# Patient Record
Sex: Male | Born: 1980 | Race: Black or African American | Hispanic: No | Marital: Married | Smoking: Never smoker
Health system: Southern US, Community
[De-identification: ages and names within clinical notes are randomized; demographics above are authoritative.]

## PROBLEM LIST (undated history)

## (undated) HISTORY — PX: NO PAST SURGERIES: SHX2092

---

## 2014-06-11 ENCOUNTER — Ambulatory Visit: Payer: Self-pay

## 2014-06-12 ENCOUNTER — Inpatient Hospital Stay: Payer: Self-pay | Admitting: Internal Medicine

## 2014-06-12 LAB — CBC WITH DIFFERENTIAL/PLATELET
BASOS ABS: 0 10*3/uL (ref 0.0–0.1)
Basophil %: 0.3 %
Eosinophil #: 0.2 10*3/uL (ref 0.0–0.7)
Eosinophil %: 1.9 %
HCT: 39.4 % — AB (ref 40.0–52.0)
HGB: 12.9 g/dL — AB (ref 13.0–18.0)
LYMPHS PCT: 28.4 %
Lymphocyte #: 2.5 10*3/uL (ref 1.0–3.6)
MCH: 31.1 pg (ref 26.0–34.0)
MCHC: 32.8 g/dL (ref 32.0–36.0)
MCV: 95 fL (ref 80–100)
Monocyte #: 1.2 x10 3/mm — ABNORMAL HIGH (ref 0.2–1.0)
Monocyte %: 14.2 %
Neutrophil #: 4.8 10*3/uL (ref 1.4–6.5)
Neutrophil %: 55.2 %
Platelet: 192 10*3/uL (ref 150–440)
RBC: 4.15 10*6/uL — AB (ref 4.40–5.90)
RDW: 13.5 % (ref 11.5–14.5)
WBC: 8.7 10*3/uL (ref 3.8–10.6)

## 2014-06-12 LAB — BASIC METABOLIC PANEL
Anion Gap: 5 — ABNORMAL LOW (ref 7–16)
BUN: 17 mg/dL (ref 7–18)
Calcium, Total: 8.8 mg/dL (ref 8.5–10.1)
Chloride: 106 mmol/L (ref 98–107)
Co2: 33 mmol/L — ABNORMAL HIGH (ref 21–32)
Creatinine: 1.05 mg/dL (ref 0.60–1.30)
EGFR (African American): >60
EGFR (Non-African Amer.): >60
Glucose: 96 mg/dL (ref 65–99)
OSMOLALITY: 288 (ref 275–301)
POTASSIUM: 4.2 mmol/L (ref 3.5–5.1)
SODIUM: 144 mmol/L (ref 136–145)

## 2014-06-17 LAB — CULTURE, BLOOD (SINGLE)

## 2014-12-04 NOTE — H&P (Signed)
PATIENT NAME:  LAJARVIS, ITALIANO MR#:  [id number] DATE OF BIRTH:  1981/06/14  DATE OF ADMISSION:  06/12/2014  PRIMARY CARE PHYSICIAN: None.  CHIEF COMPLAINT: Pain in the left leg.   HISTORY OF PRESENT ILLNESS: Mr. Faulcon is a 34 year old with no past medical history, who has been experiencing fever and chills since Monday. Concerning this, he went to the [facility]. There the patient was given doxycycline and Augmentin. The patient was also given 2 IM injections when the patient went back the following day. The patient initially noted to have some pain in the left leg; however, initially attributed it to a (Dictation Anomaly) <<MISSING TEXT>> infection; however, by Tuesday, the patient's redness significantly worsened. The patient was also seen by Dr. Sampson Goon and was given 2 doses of (Dictation Anomaly) <<MISSING TEXT>> antibiotic, which the patient could not name it. The patient continues to have worsening of the redness involving the entire leg below the knee. Concerning this, came to the Emergency Department. Workup in the Emergency Department, CBC and CMP were completely within normal limits. The patient had an ultrasound of the leg to rule out DVT, which was negative.   PAST MEDICAL HISTORY: None.   PAST SURGICAL HISTORY: None.   ALLERGIES: No known drug allergies.  HOME MEDICATIONS: 1. Tramadol 50 mg every 6 hours as needed.  2. Naprosyn (Dictation Anomaly) <<MISSING TEXT>> mg 2 times a day. 3. Doxycycline 100 mg 2 times a day.  4. Augmentin 1 tablet every 12 hours.   SOCIAL HISTORY: Has no history of smoking, drinking alcohol or using illicit drugs. Lives with his girlfriend.  FAMILY HISTORY: No obvious health problems run in the family. Both parents are healthy.   REVIEW OF SYSTEMS:  CONSTITUTIONAL: Denies any generalized weakness. EYES: No change in vision.   ENT: No change in hearing.  RESPIRATORY: No cough or shortness of breath.  CARDIOVASCULAR: No chest pain,  palpitations.  GASTROINTESTINAL: No nausea, vomiting, abdominal pain.  GENITOURINARY: No dysuria or hematuria.  HEME: No easy bruising or bleeding.  SKIN: Has redness and swelling of the left leg.  MUSCULOSKELETAL: Has pain in the left leg.  NEUROLOGIC: No weakness or numbness in any part of the body.   PHYSICAL EXAMINATION:  GENERAL: Well built, well nourished, age appropriate male, lying down in the bed, not in distress.  VITAL SIGNS: Temperature 97.9, pulse 66, blood pressure 129/78, respiratory rate of 18, oxygen saturations 99% on room air.  HEENT: Head normocephalic, atraumatic. No scleral icterus. Conjunctiva normal. Pupils equal, round and reactive. Mucous membranes are moist. (Dictation Anomaly) <<MISSING TEXT>> erythema.  NECK: Supple, no lymphadenopathy. No JVD, no carotid bruits, no thyromegaly.  CHEST: Has no focal tenderness bilaterally. Clear to auscultation.  HEART: S1, S2 regular. No murmurs are heard.  ABDOMEN: Bowel sounds plus, soft, nontender, nondistended. No hepatosplenomegaly.  EXTREMITIES: No pedal edema. Pulses 2+.  SKIN: Has redness in the left lower leg starting below the knee and extending up to just above the ankle. Redness and mild increase in size.  NEUROLOGIC: The patient is alert, oriented to place, person and time. Cranial nerves II through XII intact. Motor is 5/5 in upper and lower extremities.  MUSCULOSKELETAL: Good range of motion in all extremities.   LABORATORIES: CBC and BMP are completely within normal limits.   Ultrasound of the leg has left inguinal adenopathy (Dictation Anomaly) <<MISSING TEXT>> 2.6 x 1.1 x 1.7 cm.   ASSESSMENT AND PLAN: Mr. Ruacho is a 34 year old male who comes to the  Emergency Department with left leg cellulitis.   1. Left leg cellulitis. The patient currently does not have any fever. No elevated white blood count. No left shift; however, the patient has significant erythema and increased swelling of the left leg. We will  keep the patient on vancomycin and Zosyn. Also concerning for other processes like dermatitis. The patient has a reactive lymph node in the left leg. Start the patient on vancomycin and follow up. The patient denies having any trauma to the left leg.  2. Reactive lymphadenopathy in the left inguinal area. Most likely this is reactive from the left leg process. We will need (Dictation Anomaly) <<MISSING TEXT>> follow up closely.  3. Keep the patient on deep venous thrombosis prophylaxis with Lovenox.  TIME SPENT: 45 minutes.     ____________________________  Berton Bon:JT D: 06/13/2014 05:03:22 ET T: 06/13/2014 08:55:52 ET JOB#: [id number]  cc: Test Jamael Hoffmann, <Dictator>

## 2014-12-04 NOTE — H&P (Signed)
PATIENT NAME:  Duane Campos, Duane Campos MR#:  [id number] DATE OF BIRTH:  Jul 24, 1981  DATE OF ADMISSION:  06/12/2014  PRIMARY CARE PHYSICIAN: None.  CHIEF COMPLAINT: Pain in the left leg.   HISTORY OF PRESENT ILLNESS: Duane Campos is a 34 year old with no past medical history, who has been experiencing fever and chills since Monday. Concerning this, [facility]. There the patient was given doxycycline and Augmentin. The patient was also given 2 IM injections when the patient went back the following day. The patient initially noted to have some pain in the left leg; however, initially attributed it to a  infection; however, by Tuesday, the patient's redness significantly worsened. The patient was also seen by Duane Campos and was given 2 doses of  antibiotic, which the patient could not name it. The patient continues to have worsening of the redness involving the entire leg below the knee. Concerning this, came to the Emergency Department. Workup in the Emergency Department, CBC and CMP were completely within normal limits. The patient had an ultrasound of the leg to rule out DVT, which was negative.   PAST MEDICAL HISTORY: None.   PAST SURGICAL HISTORY: None.   ALLERGIES: No known drug allergies.  HOME MEDICATIONS: 1. Tramadol 50 mg every 6 hours as needed.  2. Naprosyn 500 mg 2 times a day. 3. Doxycycline 100 mg 2 times a day.  4. Augmentin 1 tablet every 12 hours.   SOCIAL HISTORY: Has no history of smoking, drinking alcohol or using illicit drugs. Lives with his girlfriend.  FAMILY HISTORY: No obvious health problems run in the family. Both parents are healthy.   REVIEW OF SYSTEMS:  CONSTITUTIONAL: Denies any generalized weakness. EYES: No change in vision.   ENT: No change in hearing.  RESPIRATORY: No cough or shortness of breath.  CARDIOVASCULAR: No chest pain, palpitations.  GASTROINTESTINAL: No nausea, vomiting, abdominal pain.  GENITOURINARY: No dysuria or  hematuria.  HEME: No easy bruising or bleeding.  SKIN: Has redness and swelling of the left leg.  MUSCULOSKELETAL: Has pain in the left leg.  NEUROLOGIC: No weakness or numbness in any part of the body.   PHYSICAL EXAMINATION:  GENERAL: Well built, well nourished, age appropriate male, lying down in the bed, not in distress.  VITAL SIGNS: Temperature 97.9, pulse 66, blood pressure 129/78, respiratory rate of 18, oxygen saturations 99% on room air.  HEENT: Head normocephalic, atraumatic. No scleral icterus. Conjunctiva normal. Pupils equal, round and reactive. Mucous membranes are moist. no erythema.  NECK: Supple, no lymphadenopathy. No JVD, no carotid bruits, no thyromegaly.  CHEST: Has no focal tenderness bilaterally. Clear to auscultation.  HEART: S1, S2 regular. No murmurs are heard.  ABDOMEN: Bowel sounds plus, soft, nontender, nondistended. No hepatosplenomegaly.  EXTREMITIES: No pedal edema. Pulses 2+.  SKIN: Has redness in the left lower leg starting below the knee and extending up to just above the ankle. Redness and mild increase in size.  NEUROLOGIC: The patient is alert, oriented to place, person and time. Cranial nerves II through XII intact. Motor is 5/5 in upper and lower extremities.  MUSCULOSKELETAL: Good range of motion in all extremities.   LABORATORIES: CBC and BMP are completely within normal limits.   Ultrasound of the leg has left inguinal adenopathy up to 2.6 x 1.1 x 1.7 cm.   ASSESSMENT AND PLAN: Duane Campos is a 34 year old male who comes to the Emergency Department with left leg cellulitis.   1. Left leg cellulitis. The patient  currently does not have any fever. No elevated white blood count. No left shift; however, the patient has significant erythema and increased swelling of the left leg. We will keep the patient on vancomycin and Zosyn. Also concerning for other processes like dermatitis. The patient has a reactive lymph node in the left leg. Start the patient on  vancomycin and follow up. The patient denies having any trauma to the left leg.  2. Lymphadenopathy in the left inguinal area. Most likely this is reactive from the left leg process. We will need to follow up closely.  3. Keep the patient on deep venous thrombosis prophylaxis with Lovenox.  TIME SPENT: 45 minutes.     ____________________________ Duane Campos pv:JT D: 06/13/2014 05:03:00 ET T: 06/13/2014 08:55:52 ET JOB#: [id number]  cc: Duane Campos, <Dictator> Duane Griffins Campos ELECTRONICALLY SIGNED 06/23/2014 21:07

## 2014-12-04 NOTE — Discharge Summary (Signed)
PATIENT NAME:  Duane Campos, Duane Campos MR#:  [id number] DATE OF BIRTH:  [birth year]  DATE OF ADMISSION:  06/12/2014 DATE OF DISCHARGE:  06/14/2014  DISCHARGE DIAGNOSIS: Left leg cellulitis.   DISCHARGE MEDICATIONS: Levaquin 750 mg daily for 10 days and naproxen 500 mg p.o. b.i.d.   HOSPITAL COURSE: A 34 year old male patient admitted because of left leg pain, redness, and swelling. The patient tried doxycycline and Augmentin as an outpatient for left leg cellulitis. Because of persistent fever and tenderness in the left leg he came here. The patient's white count was normal. He was afebrile on admission. His left leg ultrasound was negative. DVT study was  negative. He was admitted to hospitalist service for failed outpatient therapy and admitted for left leg cellulitis. Started on vancomycin and Zosyn. The patient received vancomycin and Zosyn for 2 days and feels much better today. His leg swelling is down and not tender anymore, and the patient wanted to go home; discharged him with Levaquin. He just moved from [place]. He said he is going to make an appointment with one of the local doctors. We asked him if he needed any assistance to have the list. He said he will take care it on his on.   PHYSICAL EXAMINATION:  VITAL SIGNS: Blood pressure  120/70, heart rate 70.  EXTREMITIES: Left leg nontender and decreased erythema.  CARDIOVASCULAR: S1, S2 regular.  LUNGS: Clear to auscultation.  ABDOMEN: Soft, nontender, nondistended. Bowel sounds present.   TIME SPENT ON DISCHARGE PREPARATION: More than 30 minutes.    ____________________________ Katha HammingSnehalatha Shauntae Reitman, MD sk:ts D: 06/14/2014 13:00:15 ET T: 06/14/2014 21:23:51 ET JOB#: [id number]  cc: Katha HammingSnehalatha Niamya Vittitow, MD, <Dictator> Katha HammingSNEHALATHA Valeta Paz MD ELECTRONICALLY SIGNED 07/02/2014 22:59

## 2016-12-10 ENCOUNTER — Other Ambulatory Visit: Payer: Self-pay | Admitting: Family Medicine

## 2016-12-10 ENCOUNTER — Ambulatory Visit
Admission: RE | Admit: 2016-12-10 | Discharge: 2016-12-10 | Disposition: A | Discharge status: Home / Self Care | Payer: BLUE CROSS/BLUE SHIELD | Source: Ambulatory Visit | Attending: Family Medicine | Admitting: Family Medicine

## 2016-12-10 DIAGNOSIS — M7989 Other specified soft tissue disorders: Secondary | ICD-10-CM | POA: Insufficient documentation

## 2017-09-11 ENCOUNTER — Emergency Department
Admission: EM | Admit: 2017-09-11 | Discharge: 2017-09-11 | Disposition: A | Discharge status: Home / Self Care | Payer: BLUE CROSS/BLUE SHIELD | Attending: Emergency Medicine | Admitting: Emergency Medicine

## 2017-09-11 ENCOUNTER — Emergency Department: Payer: BLUE CROSS/BLUE SHIELD

## 2017-09-11 ENCOUNTER — Other Ambulatory Visit: Payer: Self-pay

## 2017-09-11 DIAGNOSIS — R1013 Epigastric pain: Secondary | ICD-10-CM | POA: Diagnosis present

## 2017-09-11 DIAGNOSIS — R740 Nonspecific elevation of levels of transaminase and lactic acid dehydrogenase [LDH]: Secondary | ICD-10-CM | POA: Insufficient documentation

## 2017-09-11 DIAGNOSIS — K8020 Calculus of gallbladder without cholecystitis without obstruction: Secondary | ICD-10-CM | POA: Insufficient documentation

## 2017-09-11 DIAGNOSIS — R7401 Elevation of levels of liver transaminase levels: Secondary | ICD-10-CM

## 2017-09-11 LAB — URINALYSIS, COMPLETE (UACMP) WITH MICROSCOPIC
BACTERIA UA: NONE SEEN
BILIRUBIN URINE: NEGATIVE
GLUCOSE, UA: NEGATIVE mg/dL
Hgb urine dipstick: NEGATIVE
KETONES UR: 5 mg/dL — AB
Leukocytes, UA: NEGATIVE
Nitrite: NEGATIVE
PH: 5 (ref 5.0–8.0)
PROTEIN: NEGATIVE mg/dL
Specific Gravity, Urine: 1.02 (ref 1.005–1.030)

## 2017-09-11 LAB — COMPREHENSIVE METABOLIC PANEL
ALBUMIN: 4.5 g/dL (ref 3.5–5.0)
ALK PHOS: 64 U/L (ref 38–126)
ALT: 151 U/L — ABNORMAL HIGH (ref 17–63)
AST: 148 U/L — ABNORMAL HIGH (ref 15–41)
Anion gap: 7 (ref 5–15)
BUN: 13 mg/dL (ref 6–20)
CALCIUM: 9.3 mg/dL (ref 8.9–10.3)
CHLORIDE: 103 mmol/L (ref 101–111)
CO2: 26 mmol/L (ref 22–32)
Creatinine, Ser: 1.11 mg/dL (ref 0.61–1.24)
GFR calc non Af Amer: >60 mL/min (ref >60)
GLUCOSE: 92 mg/dL (ref 65–99)
POTASSIUM: 3.8 mmol/L (ref 3.5–5.1)
SODIUM: 136 mmol/L (ref 135–145)
Total Bilirubin: 1.2 mg/dL (ref 0.3–1.2)
Total Protein: 7.6 g/dL (ref 6.5–8.1)

## 2017-09-11 LAB — LIPASE, BLOOD: LIPASE: 29 U/L (ref 11–51)

## 2017-09-11 LAB — CBC
HEMATOCRIT: 45.9 % (ref 40.0–52.0)
HEMOGLOBIN: 15.4 g/dL (ref 13.0–18.0)
MCH: 31.5 pg (ref 26.0–34.0)
MCHC: 33.6 g/dL (ref 32.0–36.0)
MCV: 93.7 fL (ref 80.0–100.0)
Platelets: 202 10*3/uL (ref 150–440)
RBC: 4.9 MIL/uL (ref 4.40–5.90)
RDW: 14.4 % (ref 11.5–14.5)
WBC: 9.7 10*3/uL (ref 3.8–10.6)

## 2017-09-11 MED ORDER — KETOROLAC TROMETHAMINE 30 MG/ML IJ SOLN
15.0000 mg | Freq: Once | INTRAMUSCULAR | Status: AC
  Administered 2017-09-11: 15 mg via INTRAVENOUS
  Filled 2017-09-11: qty 1

## 2017-09-11 MED ORDER — IOPAMIDOL (ISOVUE-370) INJECTION 76%
100.0000 mL | Freq: Once | INTRAVENOUS | Status: AC | PRN
  Administered 2017-09-11: 100 mL via INTRAVENOUS

## 2017-09-11 NOTE — ED Notes (Signed)
Patient transported to CT 

## 2017-09-11 NOTE — ED Triage Notes (Signed)
Pt c/o upper abd pain that radiates into back. Denies N/V/D..Marland Kitchen

## 2017-09-11 NOTE — ED Notes (Signed)
First Nurse Note:  Patient here with wife, complaining of abdominal pain and chest tightness.  Woke with symptoms this AM.  Alert and oriented.  In WC.

## 2017-09-11 NOTE — ED Provider Notes (Signed)
[facility] Emergency Department Provider Note  ____________________________________________  Time seen: Approximately 4:06 PM  I have reviewed the triage vital signs and the nursing notes.   HISTORY  Chief Complaint Abdominal Pain   HPI Duane Campos is a 37 y.o. male no significant past medical history who presents for evaluation of abdominal pain. Patient reports that he woke up this morning with epigastric abdominal pain that he describes as a tearing pain radiating to his back and center of his chest. Initially he thought he was hungry. He had a biscuit and went back to sleep. He woke up 40 minutes later and the pain was persistent. Initially the pain was severe but currently 6 out of 10. He denies ever having similar pain. The pain was not worse after eating. He does report that the pain is worse with deep inspiration and he feels short of breath. He has had no nausea or vomiting, no cough or congestion. He has had chills since this morning. No dysuria or hematuria. No prior abdominal surgeries.no personal or family history of dissection, PE, no recent travel or immobilization, no leg pain or swelling, no hemoptysis, no exogenous hormones. No family history of heart disease. Patient is nonsmoker. He reports taking NSAIDs twice a week. No h/o alcohol.   History reviewed. No pertinent past medical history.  There are no active problems to display for this patient.   History reviewed. No pertinent surgical history.  Prior to Admission medications   Not on File    Allergies Patient has no known allergies.  No family history on file.  Social History Social History   Tobacco Use  . Smoking status: Never Smoker  . Smokeless tobacco: Never Used  Substance Use Topics  . Alcohol use: No    Frequency: Never  . Drug use: No    Review of Systems  Constitutional: Negative for fever. + chills Eyes: Negative for visual changes. ENT: Negative for sore  throat. Neck: No neck pain  Cardiovascular: Negative for chest pain. Respiratory: + shortness of breath. Gastrointestinal: + epigastric abdominal pain. No vomiting or diarrhea. Genitourinary: Negative for dysuria. Musculoskeletal: Negative for back pain. Skin: Negative for rash. Neurological: Negative for headaches, weakness or numbness. Psych: No SI or HI  ____________________________________________   PHYSICAL EXAM:  VITAL SIGNS: ED Triage Vitals [09/11/17 1355]  Enc Vitals Group     BP (!) 147/80     Pulse Rate 90     Resp 17     Temp 99.5 F (37.5 C)     Temp Source Oral     SpO2 100 %     Weight 231 lb (104.8 kg)     Height 6\' 3"  (1.905 m)     Head Circumference      Peak Flow      Pain Score 7     Pain Loc      Pain Edu?      Excl. in GC?     Constitutional: Alert and oriented. Well appearing and in no apparent distress. HEENT:      Head: Normocephalic and atraumatic.         Eyes: Conjunctivae are normal. Sclera is non-icteric.       Mouth/Throat: Mucous membranes are moist.       Neck: Supple with no signs of meningismus. Cardiovascular: Regular rate and rhythm. No murmurs, gallops, or rubs. 2+ symmetrical distal pulses are present in all extremities. No JVD. Respiratory: Normal respiratory effort. Lungs are clear  to auscultation bilaterally. No wheezes, crackles, or rhonchi.  Gastrointestinal: Soft, tenderness to palpation on the epigastric region,and no right lower quadrant or right upper quadrant tenderness, negative Murphy sign, and non distended with positive bowel sounds. No rebound or guarding. Genitourinary: No CVA tenderness. Musculoskeletal: Nontender with normal range of motion in all extremities. No edema, cyanosis, or erythema of extremities. Neurologic: Normal speech and language. Face is symmetric. Moving all extremities. No gross focal neurologic deficits are appreciated. Skin: Skin is warm, dry and intact. No rash noted. Psychiatric: Mood and  affect are normal. Speech and behavior are normal.  ____________________________________________   LABS (all labs ordered are listed, but only abnormal results are displayed)  Labs Reviewed  COMPREHENSIVE METABOLIC PANEL - Abnormal; Notable for the following components:      Result Value   AST 148 (*)    ALT 151 (*)    All other components within normal limits  URINALYSIS, COMPLETE (UACMP) WITH MICROSCOPIC - Abnormal; Notable for the following components:   Color, Urine YELLOW (*)    APPearance CLEAR (*)    Ketones, ur 5 (*)    Squamous Epithelial / LPF 0-5 (*)    All other components within normal limits  LIPASE, BLOOD  CBC   ____________________________________________  EKG  ED ECG REPORT I, Nita Sickle, the attending physician, personally viewed and interpreted this ECG.  Normal sinus rhythm, rate of 90, normal intervals, normal axis, no ST elevations or depressions. Normal EKG. ____________________________________________  RADIOLOGY   Interpreted by me: CTA c/a/p: no dissection  RUQ [place]: cholelithiasis with no cholecystitis   Interpretation by Radiologist:  Ct Angio Chest/abd/pel For Dissection W And/or Wo Contrast  Result Date: 09/11/2017 CLINICAL DATA:  37 year old male with upper abdominal pain radiating to the back. EXAM: CT ANGIOGRAPHY CHEST, ABDOMEN AND PELVIS TECHNIQUE: Multidetector CT imaging through the chest, abdomen and pelvis was performed using the standard protocol during bolus administration of intravenous contrast. Multiplanar reconstructed images and MIPs were obtained and reviewed to evaluate the vascular anatomy. CONTRAST:  ISOVUE-370 IOPAMIDOL (ISOVUE-370) INJECTION 76% COMPARISON:  None. FINDINGS: CTA CHEST FINDINGS Cardiovascular: Normal precontrast appearance of the thoracic aorta. Following contrast the thoracic aorta normally enhances without dissection, aneurysm, or atherosclerosis. Three vessel arch configuration with unremarkable  appearing proximal great vessels. No calcified coronary artery atherosclerosis is evident. The central pulmonary arteries are normally enhancing. There is cardiomegaly. Global chamber enlargement. See series 6, image 52. No pericardial effusion. Mediastinum/Nodes: Negative.  No mediastinal lymphadenopathy. Lungs/Pleura: Major airways are patent. No pleural effusion. The lungs are clear aside from minimal dependent atelectasis, mostly on the left. Musculoskeletal: Negative. Review of the MIP images confirms the above findings. CTA ABDOMEN AND PELVIS FINDINGS VASCULAR Normal abdominal aorta. Variant anatomy of the celiac axis incidentally noted. Major aortic branches are patent with no atherosclerosis. Normal aortoiliac bifurcation. Normal bilateral iliac arteries. Normal visible proximal femoral arteries. Review of the MIP images confirms the above findings. NON-VASCULAR Hepatobiliary: Negative liver and gallbladder. Pancreas: Negative. Spleen: Negative. Adrenals/Urinary Tract: Normal adrenal glands. Symmetric and normal appearing bilateral renal enhancement. No hydronephrosis or perinephric stranding. No nephrolithiasis identified. Symmetric proximal ureters appear normal. Along the course of the distal left ureter on series 6, image 179 there is a 5-6 millimeter calcification, but I favor this is just anterior to the distal ureter, and a phlebolith. There are numerous other pelvic phleboliths which are greater on the left. The urinary bladder is distended with an estimated volume of 444 milliliters, but otherwise unremarkable  Stomach/Bowel: Negative rectum. The sigmoid colon is redundant tracking into the right mid abdomen. There are several diverticula at the junction of the descending and sigmoid colon. Redundant transverse colon. Negative right colon and appendix. No large bowel inflammation identified. No dilated small bowel. Decompressed stomach and duodenum. No abdominal free air or free fluid. Lymphatic: No  lymphadenopathy. Reproductive: Negative. Other: No pelvic free fluid. Musculoskeletal: Negative. Review of the MIP images confirms the above findings. IMPRESSION: 1. Normal aorta and other arterial findings on CTA chest abdomen and pelvis. 2. Cardiomegaly with global appearing chamber enlargement. No pericardial effusion. 3. No acute or inflammatory process identified in the chest, abdomen, or pelvis. 4. Distended urinary bladder, 444 mL estimated volume. Electronically Signed   By: Odessa FlemingH  Hall M.D.   On: 09/11/2017 16:51   [place] Abdomen Limited Ruq  Result Date: 09/11/2017 CLINICAL DATA:  Epigastric pain EXAM: ULTRASOUND ABDOMEN LIMITED RIGHT UPPER QUADRANT COMPARISON:  None. FINDINGS: Gallbladder: Single mobile stone measures 1 cm. Small non mobile echogenic focus measures 4 mm along the posterior gallbladder wall, likely small polyp. No wall thickening or sonographic Murphy's. Common bile duct: Diameter: Normal caliber, 3 mm Liver: No focal lesion identified. Within normal limits in parenchymal echogenicity. Portal vein is patent on color Doppler imaging with normal direction of blood flow towards the liver. IMPRESSION: Cholelithiasis. Small gallbladder wall polyp. No evidence of acute cholecystitis. Electronically Signed   By: Charlett NoseKevin  Dover M.D.   On: 09/11/2017 18:32     ____________________________________________   PROCEDURES  Procedure(s) performed: None Procedures Critical Care performed:  None ____________________________________________   INITIAL IMPRESSION / ASSESSMENT AND PLAN / ED COURSE   37 y.o. male no significant past medical history who presents for evaluation of tearing epigastric abdominal pain radiating to the back and chest constant since this am associated with SOB and chills. patient is well-appearing, in no distress, vital show hypertension with BP of 147/80 but otherwise within normal limits, pulses are equal and strong in all 4 extremities, abdomen is soft with tenderness to  palpation in the epigastric region, no pulsatile mass, no rebound or guarding, no right upper quadrant or right lower quadrant tenderness. EKG with no evidence of ischemia. Labs show a normal CBC, CMP showing mild transaminitis with LFTs in the mid 100s. Normal T bili and alkaline phosphatase, normal lipase. UA with no evidence of infection or blood. Differential diagnoses including gallbladder pathology versus pancreatitis versus peptic ulcer disease versus gastritis versus dissection versus PE. We'll send patient for CT angiogram of chest abdomen pelvis.    _________________________ 6:51 PM on 09/11/2017 -----------------------------------------  CT angiogram and no acute findings. Right upper quadrant ultrasound consistent with cholelithiasis but no evidence of cholecystitis. Patient's pain is fully resolved, his abdomen is soft with no tenderness. Discussed findings of mild transaminitis and ultrasound with Dr. Everlene FarrierPabon who recommended outpatient follow-up. Discussed return precautions for returning of the pain, fever, several episodes of vomiting. Patient was comfortable with plan, follow-up, and instructions. He is going to be discharged home at this time.   As part of my medical decision making, I reviewed the following data within the electronic MEDICAL RECORD NUMBER Nursing notes reviewed and incorporated, Labs reviewed , EKG interpreted , Radiograph reviewed , A consult was requested and obtained from this/these consultant(s) Surgery, Notes from prior ED visits and [place] Controlled Substance Database    Pertinent labs & imaging results that were available during my care of the patient were reviewed by me and considered in my medical  decision making (see chart for details).    ____________________________________________   FINAL CLINICAL IMPRESSION(S) / ED DIAGNOSES  Final diagnoses:  Epigastric abdominal pain  Calculus of gallbladder without cholecystitis without obstruction  Transaminitis       NEW MEDICATIONS STARTED DURING THIS VISIT:  ED Discharge Orders    None       Note:  This document was prepared using Dragon voice recognition software and may include unintentional dictation errors.    Nita Sickle, MD 09/11/17 [phone]

## 2017-09-11 NOTE — Discharge Instructions (Signed)
Avoid fatty foods. Call DR. Pabon's office tomorrow for an appointment within the next week. Return to the ER if the pain returns and does not resolve for more than 1 hour, if you develop fever, nausea, or vomiting.

## 2017-09-13 ENCOUNTER — Ambulatory Visit (INDEPENDENT_AMBULATORY_CARE_PROVIDER_SITE_OTHER): Payer: BLUE CROSS/BLUE SHIELD | Admitting: Surgery

## 2017-09-13 ENCOUNTER — Encounter: Payer: Self-pay | Admitting: Surgery

## 2017-09-13 DIAGNOSIS — K8020 Calculus of gallbladder without cholecystitis without obstruction: Secondary | ICD-10-CM | POA: Diagnosis not present

## 2017-09-13 NOTE — Progress Notes (Signed)
09/13/2017  Reason for Visit:  Symptomatic cholelithiasis  History of Present Illness: Duane Campos is a 36 y.o. male who presents with a recent episode of biliary colic.  He was seen in the ED on 1/30 with a one day history of abdominal pain.  He has been on a Keto diet with high fat intake and had a steak fried in butter prior to onset of pain.  His pain progressed and did not improve at home and he presented to the ED.  In the ED his symptoms improved and was discharged to home.  He did have an ultrasound which showed a single gallstone measuring 1 cm and a small polyp measuring 4 mm.  Since he was discharged from the ED, his pain has improved and there is only some lingering discomfort.  He has been eating a bland diet and has not had any nausea or vomiting, fevers or chills, chest pain or shortness of breath.  Past Medical History: --Cholelithiasis (new dx)  Past Surgical History: Past Surgical History:  Procedure Laterality Date  . NO PAST SURGERIES      Home Medications: Prior to Admission medications   None    Allergies: No Known Allergies  Social History:  reports that  has never smoked. he has never used smokeless tobacco. He reports that he does not drink alcohol or use drugs.   Family History: Family History  Problem Relation Age of Onset  . Healthy Mother   . Heart murmur Father   . Parkinson's disease Father   . Cancer Neg Hx     Review of Systems: Review of Systems  Constitutional: Negative for chills and fever.  HENT: Negative for hearing loss.   Eyes: Negative for blurred vision.  Respiratory: Negative for shortness of breath.   Cardiovascular: Negative for chest pain.  Gastrointestinal: Positive for abdominal pain. Negative for blood in stool, constipation, diarrhea, nausea and vomiting.  Genitourinary: Negative for dysuria.  Musculoskeletal: Negative for myalgias.  Skin: Negative for rash.  Neurological: Negative for dizziness.   Psychiatric/Behavioral: Negative for depression.  All other systems reviewed and are negative.   Physical Exam BP 138/82   Pulse (!) 57   Temp 98.3 F (36.8 C) (Oral)   Ht 6' 3" (1.905 m)   Wt 103.4 kg (228 lb)   BMI 28.50 kg/m  CONSTITUTIONAL: No acute distress HEENT:  Normocephalic, atraumatic, extraocular motion intact. NECK: Trachea is midline, and there is no jugular venous distension. RESPIRATORY:  Lungs are clear, and breath sounds are equal bilaterally. Normal respiratory effort without pathologic use of accessory muscles. CARDIOVASCULAR: Heart is regular without murmurs, gallops, or rubs. GI: The abdomen is soft, non-distended, with only mild discomfort over the epigastric area.  Negative Murphy's sign.  MUSCULOSKELETAL:  Normal muscle strength and tone in all four extremities.  No peripheral edema or cyanosis. SKIN: Skin turgor is normal. There are no pathologic skin lesions.  NEUROLOGIC:  Motor and sensation is grossly normal.  Cranial nerves are grossly intact. PSYCH:  Alert and oriented to person, place and time. Affect is normal.  Laboratory Analysis: WBC 9.7, Hct 45.9, Na 136, K 3.8, Cl 103, CO2 26, BUN 13, Cr 1.11, Total bilirubin 1.2, AST 148, ALT 151, AP 64.  Imaging: U/S 09/11/17: IMPRESSION: Cholelithiasis. Small gallbladder wall polyp. No evidence of acute cholecystitis.  Assessment and Plan: This is a 36 y.o. male who presents with symptomatic cholelithiasis.  I have independently viewed the patient's imaging study and reviewed his laboratory results.    The patient comes with biliary colic episode.  He has been doing well since and has been able to adhere to a bland diet over the past couple of days.  Discussed with the patient that currently we have the option of watchful waiting with dietary modifications vs surgical management.  He wants to purse conservative management for now and will continue this coming week with a low fat diet.  Based on how he does  and his symptoms, he may end up choosing to continue forward with surgery.  Will give him more information on low fat diets as well.  He does appear hesitant about surgery but does understand that he may eventually need it.   Did discuss with the patient the role of laparoscopic vs open cholecystectomy and some of the post-op expectations and outcomes including restriction of no heavy lifting or pushing of more than 10-15 lbs for 4 weeks.  He will think it over and based on how he does next week, will call us to let us know.   Face-to-face time spent with the patient and care providers was 45 minutes, with more than 50% of the time spent counseling, educating, and coordinating care of the patient.     Toya Palacios Luis Cassadie Pankonin, MD [facility]   

## 2017-09-13 NOTE — Patient Instructions (Signed)
Low-Fat Diet for Pancreatitis or Gallbladder Conditions A low-fat diet can be helpful if you have pancreatitis or a gallbladder condition. With these conditions, your pancreas and gallbladder have trouble digesting fats. A healthy eating plan with less fat will help rest your pancreas and gallbladder and reduce your symptoms. What do I need to know about this diet?  Eat a low-fat diet. ? Reduce your fat intake to less than 20-30% of your total daily calories. This is less than 50-60 g of fat per day. ? Remember that you need some fat in your diet. Ask your dietician what your daily goal should be. ? Choose nonfat and low-fat healthy foods. Look for the words "nonfat," "low fat," or "fat free." ? As a guide, look on the label and choose foods with less than 3 g of fat per serving. Eat only one serving.  Avoid alcohol.  Do not smoke. If you need help quitting, talk with your health care provider.  Eat small frequent meals instead of three large heavy meals. What foods can I eat? Grains Include healthy grains and starches such as potatoes, wheat bread, fiber-rich cereal, and brown rice. Choose whole grain options whenever possible. In adults, whole grains should account for 45-65% of your daily calories. Fruits and Vegetables Eat plenty of fruits and vegetables. Fresh fruits and vegetables add fiber to your diet. Meats and Other Protein Sources Eat lean meat such as chicken and pork. Trim any fat off of meat before cooking it. Eggs, fish, and beans are other sources of protein. In adults, these foods should account for 10-35% of your daily calories. Dairy Choose low-fat milk and dairy options. Dairy includes fat and protein, as well as calcium. Fats and Oils Limit high-fat foods such as fried foods, sweets, baked goods, sugary drinks. Other Creamy sauces and condiments, such as mayonnaise, can add extra fat. Think about whether or not you need to use them, or use smaller amounts or low fat  options. What foods are not recommended?  High fat foods, such as: ? Baked goods. ? Ice cream. ? French toast. ? Sweet rolls. ? Pizza. ? Cheese bread. ? Foods covered with batter, butter, creamy sauces, or cheese. ? Fried foods. ? Sugary drinks and desserts.  Foods that cause gas or bloating This information is not intended to replace advice given to you by your health care provider. Make sure you discuss any questions you have with your health care provider. Document Released: 08/04/2013 Document Revised: 01/05/2016 Document Reviewed: 07/13/2013 Elsevier Interactive Patient Education  2017 [employer]  

## 2017-09-13 NOTE — H&P (View-Only) (Signed)
09/13/2017  Reason for Visit:  Symptomatic cholelithiasis  History of Present Illness: Duane Campos is a 37 y.o. male who presents with a recent episode of biliary colic.  He was seen in the ED on 1/30 with a one day history of abdominal pain.  He has been on a Keto diet with high fat intake and had a steak fried in butter prior to onset of pain.  His pain progressed and did not improve at home and he presented to the ED.  In the ED his symptoms improved and was discharged to home.  He did have an ultrasound which showed a single gallstone measuring 1 cm and a small polyp measuring 4 mm.  Since he was discharged from the ED, his pain has improved and there is only some lingering discomfort.  He has been eating a bland diet and has not had any nausea or vomiting, fevers or chills, chest pain or shortness of breath.  Past Medical History: --Cholelithiasis (new dx)  Past Surgical History: Past Surgical History:  Procedure Laterality Date  . NO PAST SURGERIES      Home Medications: Prior to Admission medications   None    Allergies: No Known Allergies  Social History:  reports that  has never smoked. he has never used smokeless tobacco. He reports that he does not drink alcohol or use drugs.   Family History: Family History  Problem Relation Age of Onset  . Healthy Mother   . Heart murmur Father   . Parkinson's disease Father   . Cancer Neg Hx     Review of Systems: Review of Systems  Constitutional: Negative for chills and fever.  HENT: Negative for hearing loss.   Eyes: Negative for blurred vision.  Respiratory: Negative for shortness of breath.   Cardiovascular: Negative for chest pain.  Gastrointestinal: Positive for abdominal pain. Negative for blood in stool, constipation, diarrhea, nausea and vomiting.  Genitourinary: Negative for dysuria.  Musculoskeletal: Negative for myalgias.  Skin: Negative for rash.  Neurological: Negative for dizziness.   Psychiatric/Behavioral: Negative for depression.  All other systems reviewed and are negative.   Physical Exam BP 138/82   Pulse (!) 57   Temp 98.3 F (36.8 C) (Oral)   Ht 6\' 3"  (1.905 m)   Wt 103.4 kg (228 lb)   BMI 28.50 kg/m  CONSTITUTIONAL: No acute distress HEENT:  Normocephalic, atraumatic, extraocular motion intact. NECK: Trachea is midline, and there is no jugular venous distension. RESPIRATORY:  Lungs are clear, and breath sounds are equal bilaterally. Normal respiratory effort without pathologic use of accessory muscles. CARDIOVASCULAR: Heart is regular without murmurs, gallops, or rubs. GI: The abdomen is soft, non-distended, with only mild discomfort over the epigastric area.  Negative Murphy's sign.  MUSCULOSKELETAL:  Normal muscle strength and tone in all four extremities.  No peripheral edema or cyanosis. SKIN: Skin turgor is normal. There are no pathologic skin lesions.  NEUROLOGIC:  Motor and sensation is grossly normal.  Cranial nerves are grossly intact. PSYCH:  Alert and oriented to person, place and time. Affect is normal.  Laboratory Analysis: WBC 9.7, Hct 45.9, Na 136, K 3.8, Cl 103, CO2 26, BUN 13, Cr 1.11, Total bilirubin 1.2, AST 148, ALT 151, AP 64.  Imaging: U/S 09/11/17: IMPRESSION: Cholelithiasis. Small gallbladder wall polyp. No evidence of acute cholecystitis.  Assessment and Plan: This is a 37 y.o. male who presents with symptomatic cholelithiasis.  I have independently viewed the patient's imaging study and reviewed his laboratory results.  The patient comes with biliary colic episode.  He has been doing well since and has been able to adhere to a bland diet over the past couple of days.  Discussed with the patient that currently we have the option of watchful waiting with dietary modifications vs surgical management.  He wants to purse conservative management for now and will continue this coming week with a low fat diet.  Based on how he does  and his symptoms, he may end up choosing to continue forward with surgery.  Will give him more information on low fat diets as well.  He does appear hesitant about surgery but does understand that he may eventually need it.   Did discuss with the patient the role of laparoscopic vs open cholecystectomy and some of the post-op expectations and outcomes including restriction of no heavy lifting or pushing of more than 10-15 lbs for 4 weeks.  He will think it over and based on how he does next week, will call us to let us know.   Face-to-face time spent with the patient and care providers was 45 minutes, with more than 50% of the time spent counseling, educating, and coordinating care of the patient.     Howie Ill, MD [facility]

## 2017-09-24 ENCOUNTER — Telehealth: Payer: Self-pay | Admitting: Surgery

## 2017-09-24 NOTE — Telephone Encounter (Signed)
Pt advised of pre op date/time and sx date. Sx: 10/09/17 with Dr Piscoya--laparoscopic cholecystectomy.  Pre op: 10/02/17 between 9-1:00pm--phone interview.   Patient made aware to call [phone], between 1-3:00pm the day before surgery, to find out what time to arrive.

## 2017-10-02 ENCOUNTER — Encounter
Admission: RE | Admit: 2017-10-02 | Discharge: 2017-10-02 | Disposition: A | Discharge status: Home / Self Care | Payer: BLUE CROSS/BLUE SHIELD | Source: Ambulatory Visit | Attending: Surgery | Admitting: Surgery

## 2017-10-02 ENCOUNTER — Other Ambulatory Visit: Payer: Self-pay

## 2017-10-02 NOTE — Patient Instructions (Signed)
[facility]: [facility] 2nd floor medical mall [facility](Medical Mall Entrance-take elevator on left to 2nd floor.  Check in with surgery information desk.) To find out your arrival time please call [phone] between 1PM - 3PM on 10-08-17 TUESDAY  Remember: Instructions that are not followed completely may result in serious medical risk, up to and including death, or upon the discretion of your surgeon and anesthesiologist your surgery may need to be rescheduled.    _x___ 1. Do not eat food after midnight the night before your procedure. NO GUM OR CANDY AFTER MIDNIGHT.  You may drink clear liquids up to 2 hours before you are scheduled to arrive at the hospital for your procedure.  Do not drink clear liquids within 2 hours of your scheduled arrival to the hospital.  Clear liquids include  --Water or Apple juice without pulp  --Clear carbohydrate beverage such as ClearFast or Gatorade  --Black Coffee or Clear Tea (No milk, no creamers, do not add anything to  the coffee or Tea    __x__ 2. No Alcohol for 24 hours before or after surgery.   __x__3. No Smoking or e-cigarettes for 24 prior to surgery.  Do not use any chewable tobacco products for at least 6 hour prior to surgery   ____  4. Bring all medications with you on the day of surgery if instructed.    __x__ 5. Notify your doctor if there is any change in your medical condition     (cold, fever, infections).    x___6. On the morning of surgery brush your teeth with toothpaste and water.  You may rinse your mouth with mouth wash if you wish.  Do not swallow any toothpaste or mouthwash.   Do not wear jewelry, make-up, hairpins, clips or nail polish.  Do not wear lotions, powders, or perfumes. You may wear deodorant.  Do not shave 48 hours prior to surgery. Men may shave face and neck.  Do not bring valuables to the hospital.    [facility] is not responsible for any belongings or  valuables.               Contacts, dentures or bridgework may not be worn into surgery.  Leave your suitcase in the car. After surgery it may be brought to your room.  For patients admitted to the hospital, discharge time is determined by your treatment team.  _  Patients discharged the day of surgery will not be allowed to drive home.  You will need someone to drive you home and stay with you the night of your procedure.    Please read over the following fact sheets that you were given:   [facility] Preparing for Surgery and or MRSA Information   ____ Take anti-hypertensive listed below, cardiac, seizure, asthma, anti-reflux and psychiatric medicines. These include:  1. NONE  2.  3.  4.  5.  6.  ____Fleets enema or Magnesium Citrate as directed.   ____ Use CHG Soap or sage wipes as directed on instruction sheet   ____ Use inhalers on the day of surgery and bring to hospital day of surgery  ____ Stop Metformin and Janumet 2 days prior to surgery.    ____ Take 1/2 of usual insulin dose the night before surgery and none on the morning surgery.   ____ Follow recommendations from Cardiologist, Pulmonologist or PCP regarding stopping Aspirin, Coumadin, Plavix ,Eliquis, Effient, or Pradaxa, and Pletal.  X____Stop  Anti-inflammatories such as Advil, Aleve, Ibuprofen, Motrin, Naproxen, Naprosyn, Goodies powders or aspirin products NOW-OK to take Tylenol   ____ Stop supplements until after surgery.     ____ Bring C-Pap to the hospital.

## 2017-10-08 MED ORDER — CEFAZOLIN SODIUM-DEXTROSE 2-4 GM/100ML-% IV SOLN
2.0000 g | INTRAVENOUS | Status: AC
  Administered 2017-10-09: 2 g via INTRAVENOUS

## 2017-10-09 ENCOUNTER — Encounter: Payer: Self-pay | Admitting: *Deleted

## 2017-10-09 ENCOUNTER — Ambulatory Visit: Payer: BLUE CROSS/BLUE SHIELD | Admitting: Anesthesiology

## 2017-10-09 ENCOUNTER — Ambulatory Visit
Admission: RE | Admit: 2017-10-09 | Discharge: 2017-10-09 | Disposition: A | Discharge status: Home / Self Care | Payer: BLUE CROSS/BLUE SHIELD | Source: Ambulatory Visit | Attending: Surgery | Admitting: Surgery

## 2017-10-09 ENCOUNTER — Encounter
Admission: RE | Disposition: A | Discharge status: Home / Self Care | Payer: Self-pay | Source: Ambulatory Visit | Attending: Surgery

## 2017-10-09 ENCOUNTER — Other Ambulatory Visit: Payer: Self-pay

## 2017-10-09 DIAGNOSIS — K429 Umbilical hernia without obstruction or gangrene: Secondary | ICD-10-CM

## 2017-10-09 DIAGNOSIS — K8020 Calculus of gallbladder without cholecystitis without obstruction: Secondary | ICD-10-CM | POA: Diagnosis not present

## 2017-10-09 HISTORY — PX: CHOLECYSTECTOMY: SHX55

## 2017-10-09 SURGERY — LAPAROSCOPIC CHOLECYSTECTOMY
Anesthesia: General | Wound class: Clean Contaminated

## 2017-10-09 MED ORDER — MIDAZOLAM HCL 2 MG/2ML IJ SOLN
INTRAMUSCULAR | Status: DC | PRN
  Administered 2017-10-09: 2 mg via INTRAVENOUS

## 2017-10-09 MED ORDER — SUGAMMADEX SODIUM 200 MG/2ML IV SOLN
INTRAVENOUS | Status: AC
  Filled 2017-10-09: qty 2

## 2017-10-09 MED ORDER — DEXAMETHASONE SODIUM PHOSPHATE 10 MG/ML IJ SOLN
INTRAMUSCULAR | Status: AC
  Filled 2017-10-09: qty 1

## 2017-10-09 MED ORDER — FENTANYL CITRATE (PF) 100 MCG/2ML IJ SOLN
INTRAMUSCULAR | Status: DC | PRN
  Administered 2017-10-09 (×4): 50 ug via INTRAVENOUS

## 2017-10-09 MED ORDER — DEXAMETHASONE SODIUM PHOSPHATE 10 MG/ML IJ SOLN
INTRAMUSCULAR | Status: DC | PRN
  Administered 2017-10-09: 10 mg via INTRAVENOUS

## 2017-10-09 MED ORDER — GABAPENTIN 300 MG PO CAPS
ORAL_CAPSULE | ORAL | Status: AC
  Administered 2017-10-09: 300 mg via ORAL
  Filled 2017-10-09: qty 1

## 2017-10-09 MED ORDER — SUGAMMADEX SODIUM 200 MG/2ML IV SOLN
INTRAVENOUS | Status: DC | PRN
  Administered 2017-10-09: 200 mg via INTRAVENOUS

## 2017-10-09 MED ORDER — FENTANYL CITRATE (PF) 100 MCG/2ML IJ SOLN
INTRAMUSCULAR | Status: AC
  Filled 2017-10-09: qty 2

## 2017-10-09 MED ORDER — CHLORHEXIDINE GLUCONATE CLOTH 2 % EX PADS: 6.0000 | MEDICATED_PAD | Freq: Once | CUTANEOUS | Status: DC

## 2017-10-09 MED ORDER — FAMOTIDINE 20 MG PO TABS
20.0000 mg | ORAL_TABLET | Freq: Once | ORAL | Status: AC
  Administered 2017-10-09: 20 mg via ORAL

## 2017-10-09 MED ORDER — BUPIVACAINE-EPINEPHRINE (PF) 0.25% -1:200000 IJ SOLN
INTRAMUSCULAR | Status: DC | PRN
  Administered 2017-10-09: 30 mL via PERINEURAL

## 2017-10-09 MED ORDER — BUPIVACAINE-EPINEPHRINE (PF) 0.25% -1:200000 IJ SOLN
INTRAMUSCULAR | Status: AC
  Filled 2017-10-09: qty 30

## 2017-10-09 MED ORDER — ACETAMINOPHEN 500 MG PO TABS
ORAL_TABLET | ORAL | Status: AC
  Administered 2017-10-09: 1000 mg via ORAL
  Filled 2017-10-09: qty 2

## 2017-10-09 MED ORDER — GABAPENTIN 300 MG PO CAPS
300.0000 mg | ORAL_CAPSULE | ORAL | Status: AC
  Administered 2017-10-09: 300 mg via ORAL

## 2017-10-09 MED ORDER — LACTATED RINGERS IV SOLN
INTRAVENOUS | Status: DC
  Administered 2017-10-09 (×2): via INTRAVENOUS

## 2017-10-09 MED ORDER — PROMETHAZINE HCL 25 MG/ML IJ SOLN: 6.2500 mg | INTRAMUSCULAR | Status: DC | PRN

## 2017-10-09 MED ORDER — FENTANYL CITRATE (PF) 100 MCG/2ML IJ SOLN
25.0000 ug | INTRAMUSCULAR | Status: DC | PRN
  Administered 2017-10-09 (×2): 25 ug via INTRAVENOUS
  Administered 2017-10-09: 50 ug via INTRAVENOUS

## 2017-10-09 MED ORDER — PROPOFOL 10 MG/ML IV BOLUS
INTRAVENOUS | Status: AC
  Filled 2017-10-09: qty 20

## 2017-10-09 MED ORDER — ROCURONIUM BROMIDE 100 MG/10ML IV SOLN
INTRAVENOUS | Status: DC | PRN
  Administered 2017-10-09 (×2): 10 mg via INTRAVENOUS
  Administered 2017-10-09: 40 mg via INTRAVENOUS

## 2017-10-09 MED ORDER — LIDOCAINE HCL (CARDIAC) 20 MG/ML IV SOLN
INTRAVENOUS | Status: DC | PRN
  Administered 2017-10-09: 40 mg via INTRAVENOUS

## 2017-10-09 MED ORDER — CEFAZOLIN SODIUM-DEXTROSE 2-4 GM/100ML-% IV SOLN
INTRAVENOUS | Status: AC
  Filled 2017-10-09: qty 100

## 2017-10-09 MED ORDER — OXYCODONE HCL 5 MG PO TABS: 5.0000 mg | ORAL_TABLET | Freq: Four times a day (QID) | ORAL | 0 refills | Status: DC | PRN

## 2017-10-09 MED ORDER — ONDANSETRON HCL 4 MG/2ML IJ SOLN
INTRAMUSCULAR | Status: AC
  Filled 2017-10-09: qty 2

## 2017-10-09 MED ORDER — LIDOCAINE HCL (PF) 2 % IJ SOLN
INTRAMUSCULAR | Status: AC
  Filled 2017-10-09: qty 10

## 2017-10-09 MED ORDER — FAMOTIDINE 20 MG PO TABS
ORAL_TABLET | ORAL | Status: AC
  Administered 2017-10-09: 20 mg via ORAL
  Filled 2017-10-09: qty 1

## 2017-10-09 MED ORDER — PROPOFOL 10 MG/ML IV BOLUS
INTRAVENOUS | Status: DC | PRN
  Administered 2017-10-09: 140 mg via INTRAVENOUS

## 2017-10-09 MED ORDER — IBUPROFEN 600 MG PO TABS: 600.0000 mg | ORAL_TABLET | Freq: Three times a day (TID) | ORAL | 0 refills | Status: AC | PRN

## 2017-10-09 MED ORDER — MIDAZOLAM HCL 2 MG/2ML IJ SOLN
INTRAMUSCULAR | Status: AC
  Filled 2017-10-09: qty 2

## 2017-10-09 MED ORDER — ACETAMINOPHEN 500 MG PO TABS
1000.0000 mg | ORAL_TABLET | ORAL | Status: AC
  Administered 2017-10-09: 1000 mg via ORAL

## 2017-10-09 MED ORDER — FENTANYL CITRATE (PF) 100 MCG/2ML IJ SOLN
INTRAMUSCULAR | Status: AC
  Administered 2017-10-09: 25 ug via INTRAVENOUS
  Filled 2017-10-09: qty 2

## 2017-10-09 MED ORDER — FENTANYL CITRATE (PF) 100 MCG/2ML IJ SOLN
INTRAMUSCULAR | Status: AC
Start: 2017-10-09 — End: ?
  Filled 2017-10-09: qty 2

## 2017-10-09 SURGICAL SUPPLY — 44 items
APPLIER CLIP 5 13 M/L LIGAMAX5 (MISCELLANEOUS) ×3
BLADE SURG 15 STRL LF DISP TIS (BLADE) ×1 IMPLANT
BLADE SURG 15 STRL SS (BLADE) ×2
CANISTER SUCT 1200ML W/VALVE (MISCELLANEOUS) ×3 IMPLANT
CATH CHOLANGI 4FR 420404F (CATHETERS) IMPLANT
CHLORAPREP W/TINT 26ML (MISCELLANEOUS) ×3 IMPLANT
CLIP APPLIE 5 13 M/L LIGAMAX5 (MISCELLANEOUS) ×1 IMPLANT
CONRAY 60ML FOR OR (MISCELLANEOUS) ×3 IMPLANT
DERMABOND ADVANCED (GAUZE/BANDAGES/DRESSINGS) ×2
DERMABOND ADVANCED .7 DNX12 (GAUZE/BANDAGES/DRESSINGS) ×1 IMPLANT
DRAPE C-ARM XRAY 36X54 (DRAPES) IMPLANT
ELECT REM PT RETURN 9FT ADLT (ELECTROSURGICAL) ×3
ELECTRODE REM PT RTRN 9FT ADLT (ELECTROSURGICAL) ×1 IMPLANT
FILTER LAP SMOKE EVAC STRL (MISCELLANEOUS) ×3 IMPLANT
GLOVE SURG SYN 7.0 (GLOVE) ×3 IMPLANT
GLOVE SURG SYN 7.5  E (GLOVE) ×2
GLOVE SURG SYN 7.5 E (GLOVE) ×1 IMPLANT
GOWN STRL REUS W/ TWL LRG LVL3 (GOWN DISPOSABLE) ×3 IMPLANT
GOWN STRL REUS W/TWL LRG LVL3 (GOWN DISPOSABLE) ×6
IRRIGATION STRYKERFLOW (MISCELLANEOUS) IMPLANT
IRRIGATOR STRYKERFLOW (MISCELLANEOUS)
IV CATH ANGIO 12GX3 LT BLUE (NEEDLE) ×3 IMPLANT
IV NS 1000ML (IV SOLUTION)
IV NS 1000ML BAXH (IV SOLUTION) IMPLANT
JACKSON PRATT 10 (INSTRUMENTS) IMPLANT
L-HOOK LAP DISP 36CM (ELECTROSURGICAL) ×3
LABEL OR SOLS (LABEL) ×3 IMPLANT
LHOOK LAP DISP 36CM (ELECTROSURGICAL) ×1 IMPLANT
NEEDLE HYPO 22GX1.5 SAFETY (NEEDLE) ×6 IMPLANT
PACK LAP CHOLECYSTECTOMY (MISCELLANEOUS) ×3 IMPLANT
PENCIL ELECTRO HAND CTR (MISCELLANEOUS) ×3 IMPLANT
POUCH SPECIMEN RETRIEVAL 10MM (ENDOMECHANICALS) ×3 IMPLANT
SCISSORS METZENBAUM CVD 33 (INSTRUMENTS) ×3 IMPLANT
SLEEVE ADV FIXATION 5X100MM (TROCAR) ×9 IMPLANT
SPONGE VERSALON 4X4 4PLY (MISCELLANEOUS) IMPLANT
SUT MNCRL 4-0 (SUTURE) ×2
SUT MNCRL 4-0 27XMFL (SUTURE) ×1
SUT VIC AB 3-0 SH 27 (SUTURE) ×2
SUT VIC AB 3-0 SH 27X BRD (SUTURE) ×1 IMPLANT
SUT VICRYL 0 AB UR-6 (SUTURE) ×3 IMPLANT
SUTURE MNCRL 4-0 27XMF (SUTURE) ×1 IMPLANT
TROCAR BALLN GELPORT 12X130M (ENDOMECHANICALS) ×3 IMPLANT
TROCAR Z-THREAD OPTICAL 5X100M (TROCAR) ×3 IMPLANT
TUBING INSUFFLATION (TUBING) ×3 IMPLANT

## 2017-10-09 NOTE — Anesthesia Preprocedure Evaluation (Signed)
Anesthesia Evaluation  Patient identified by MRN, date of birth, ID band Patient awake    Reviewed: Allergy & Precautions, H&P , NPO status , Patient's Chart, lab work & pertinent test results, reviewed documented beta blocker date and time   History of Anesthesia Complications Negative for: history of anesthetic complications  Airway Mallampati: I  TM Distance: >3 FB Neck ROM: full    Dental  (+) Teeth Intact, Dental Advidsory Given Permanent retainer on the bottom:   Pulmonary neg pulmonary ROS,           Cardiovascular Exercise Tolerance: Good negative cardio ROS       Neuro/Psych negative neurological ROS  negative psych ROS   GI/Hepatic negative GI ROS, Neg liver ROS,   Endo/Other  negative endocrine ROS  Renal/GU negative Renal ROS  negative genitourinary   Musculoskeletal   Abdominal   Peds  Hematology negative hematology ROS (+)   Anesthesia Other Findings History reviewed. No pertinent past medical history.   Reproductive/Obstetrics negative OB ROS                             Anesthesia Physical Anesthesia Plan  ASA: I  Anesthesia Plan: General   Post-op Pain Management:    Induction: Intravenous  PONV Risk Score and Plan: 2 and Ondansetron and Dexamethasone  Airway Management Planned: Oral ETT  Additional Equipment:   Intra-op Plan:   Post-operative Plan: Extubation in OR  Informed Consent: I have reviewed the patients History and Physical, chart, labs and discussed the procedure including the risks, benefits and alternatives for the proposed anesthesia with the patient or authorized representative who has indicated his/her understanding and acceptance.   Dental Advisory Given  Plan Discussed with: Anesthesiologist, CRNA and Surgeon  Anesthesia Plan Comments:         Anesthesia Quick Evaluation

## 2017-10-09 NOTE — Transfer of Care (Signed)
Immediate Anesthesia Transfer of Care Note  Patient: Duane Campos  Procedure(s) Performed: LAPAROSCOPIC CHOLECYSTECTOMY (N/A )  Patient Location: PACU  Anesthesia Type:General  Level of Consciousness: awake  Airway & Oxygen Therapy: Patient Spontanous Breathing and Patient connected to face mask oxygen  Post-op Assessment: Report given to RN and Post -op Vital signs reviewed and stable  Post vital signs: Reviewed and stable  Last Vitals:  Vitals:   10/09/17 1224 10/09/17 1557  BP: 138/88 121/73  Pulse: (!) 54 72  Resp: 20 (!) 21  Temp: (!) 36.2 C (!) 36.3 C  SpO2: 100% 100%    Last Pain:  Vitals:   10/09/17 1557  TempSrc:   PainSc: Asleep         Complications: No apparent anesthesia complications

## 2017-10-09 NOTE — Op Note (Signed)
  Procedure Date:  10/09/2017  Pre-operative Diagnosis:  Symptomatic cholelithiasis  Post-operative Diagnosis:  Symptomatic cholelithiasis and umbilical hernia  Procedure:  Laparoscopic cholecystectomy and umbilical hernia repair  Surgeon:  Howie IllJose Luis Anthonymichael Munday, MD  Anesthesia:  General endotracheal  Estimated Blood Loss:  5 ml  Specimens:  gallbladder  Complications:  None  Indications for Procedure:  This is a 37 y.o. male who presents with abdominal pain and workup revealing symptomatic cholelithiasis.  The benefits, complications, treatment options, and expected outcomes were discussed with the patient. The risks of bleeding, infection, recurrence of symptoms, failure to resolve symptoms, bile duct damage, bile duct leak, retained common bile duct stone, bowel injury, and need for further procedures were all discussed with the patient and he was willing to proceed.  Description of Procedure: The patient was correctly identified in the preoperative area and brought into the operating room.  The patient was placed supine with VTE prophylaxis in place.  Appropriate time-outs were performed.  Anesthesia was induced and the patient was intubated.  Appropriate antibiotics were infused.  The abdomen was prepped and draped in a sterile fashion.  Upon palpation of the umbilicus, he was noted to have a small umbilical hernia.  An infraumbilical incision was made. Cautery was used to dissect down along the umbilical stalk and the umbilical hernia was found, measuring about 5 mm.  It was expanded in size to allow for the Hasson trocar placement. The abdominal cavity was entered without injury.  Pneumoperitoneum was obtained with appropriate opening pressures.  A 5-mm port was placed in the subxiphoid area and two 5-mm ports were placed in the right upper quadrant under direct visualization.  The gallbladder was identified.  The fundus was grasped and retracted cephalad.  Adhesions were lysed bluntly and  with electrocautery. The infundibulum was grasped and retracted laterally, exposing the peritoneum overlying the gallbladder.  This was incised with electrocautery and extended on either side of the gallbladder.  The cystic duct and cystic artery were clearly identified and bluntly dissected.  Both were clipped twice proximally and once distally, cutting in between.  The gallbladder was taken from the gallbladder fossa in a retrograde fashion with electrocautery. The gallbladder was placed in an Endocatch bag and brought out via the umbilical incision. The liver bed was inspected and any bleeding was controlled with electrocautery. The right upper quadrant was then inspected again revealing intact clips, no bleeding, and no ductal injury.  The 5 mm ports were removed under direct visualization and the Hasson trocar was removed.  The umbilical hernia defect was closed using 0 vicryl suture.  Local anesthetic was infused in all incisions.  The umbilical incision was closed in two layers using 3-0 Vicryl to tack the umbilicus better to the fascia and to approximate the dermis.  Then all the incisions were closed with 4-0 Monocryl.  The wounds were cleaned and sealed with DermaBond.  The patient was emerged from anesthesia and extubated and brought to the recovery room for further management.  The patient tolerated the procedure well and all counts were correct at the end of the case.   Howie IllJose Luis Aylan Bayona, MD

## 2017-10-09 NOTE — Interval H&P Note (Signed)
History and Physical Interval Note:  10/09/2017 1:55 PM  Duane LevanDerrick Warga  has presented today for surgery, with the diagnosis of symptomatic cholelithiasis  The various methods of treatment have been discussed with the patient and family. After consideration of risks, benefits and other options for treatment, the patient has consented to  Procedure(s): LAPAROSCOPIC CHOLECYSTECTOMY (N/A) as a surgical intervention .  The patient's history has been reviewed, patient examined, no change in status, stable for surgery.  I have reviewed the patient's chart and labs.  Questions were answered to the patient's satisfaction.     Lavergne Hiltunen

## 2017-10-09 NOTE — Anesthesia Procedure Notes (Signed)
Procedure Name: Intubation Date/Time: 10/09/2017 2:30 PM Performed by: Allean Found, CRNA Pre-anesthesia Checklist: Patient identified, Emergency Drugs available, Suction available, Patient being monitored and Timeout performed Patient Re-evaluated:Patient Re-evaluated prior to induction Oxygen Delivery Method: Circle system utilized Preoxygenation: Pre-oxygenation with 100% oxygen Induction Type: IV induction Ventilation: Mask ventilation without difficulty Laryngoscope Size: Mac and 3 Grade View: Grade I Tube type: Oral Tube size: 7.0 mm Number of attempts: 1 Airway Equipment and Method: Stylet Placement Confirmation: ETT inserted through vocal cords under direct vision,  positive ETCO2 and breath sounds checked- equal and bilateral Secured at: 24 cm Tube secured with: Tape Dental Injury: Teeth and Oropharynx as per pre-operative assessment

## 2017-10-09 NOTE — Anesthesia Postprocedure Evaluation (Signed)
Anesthesia Post Note  Patient: Duane Campos  Procedure(s) Performed: LAPAROSCOPIC CHOLECYSTECTOMY (N/A )  Patient location during evaluation: PACU Anesthesia Type: General Level of consciousness: awake and alert Pain management: pain level controlled Vital Signs Assessment: post-procedure vital signs reviewed and stable Respiratory status: spontaneous breathing and respiratory function stable Cardiovascular status: stable Anesthetic complications: no     Last Vitals:  Vitals:   10/09/17 1635 10/09/17 1638  BP:    Pulse: 69 83  Resp: (!) 0 10  Temp:  37.2 C  SpO2: 97% 96%    Last Pain:  Vitals:   10/09/17 1638  TempSrc:   PainSc: 0-No pain                 KEPHART,WILLIAM K

## 2017-10-09 NOTE — Anesthesia Post-op Follow-up Note (Signed)
Anesthesia QCDR form completed.        

## 2017-10-09 NOTE — Discharge Instructions (Signed)

## 2017-10-10 ENCOUNTER — Encounter: Payer: Self-pay | Admitting: Surgery

## 2017-10-11 LAB — SURGICAL PATHOLOGY

## 2017-10-30 ENCOUNTER — Ambulatory Visit (INDEPENDENT_AMBULATORY_CARE_PROVIDER_SITE_OTHER): Payer: BLUE CROSS/BLUE SHIELD | Admitting: Surgery

## 2017-10-30 ENCOUNTER — Encounter: Payer: Self-pay | Admitting: Surgery

## 2017-10-30 VITALS — BP 145/81 | HR 65 | Temp 97.5°F | Ht 75.0 in | Wt 223.2 lb

## 2017-10-30 DIAGNOSIS — Z09 Encounter for follow-up examination after completed treatment for conditions other than malignant neoplasm: Secondary | ICD-10-CM

## 2017-10-30 NOTE — Progress Notes (Signed)
10/30/2017  HPI: Patient is s/p laparoscopic cholecystectomy on 2/27.  He presents today for follow up.  He reports doing very well, without any issues.  He is tolerating a diet, no longer having any post-prandial discomfort, without any nausea, vomiting, diarrhea, or constipation. He is wondering when he can resume his exercises.  Vital signs: BP (!) 145/81   Pulse 65   Temp (!) 97.5 F (36.4 C) (Oral)   Ht 6\' 3"  (1.905 m)   Wt 101.2 kg (223 lb 3.2 oz)   BMI 27.90 kg/m    Physical Exam: Constitutional: No acute distress Abdomen:  Soft, nondistended, nontender to palpation.  Incisions are healing well, without any evidence of infection.  Assessment/Plan: 37 yo male s/p laparoscopic cholecystectomy  --Reviewed pathology with patient.  Cholesterolosis and cholelithiasis, without any dysplasia or malignancy --Patient still has one more week of no heavy lifting or pushing of no more than 10-15 lbs.  He may resume exercises and activities in one week.  Discussed with patient to resume exercises slowly --Patient may follow up on an as needed basis.   Howie IllJose Luis Ladesha Pacini, MD [facility]

## 2017-10-30 NOTE — Patient Instructions (Addendum)

## 2018-03-16 IMAGING — US US EXTREM LOW VENOUS*L*
1 series · 13 of 24 positions shown · non-contrast
Comparison: None.

CLINICAL DATA: Left leg swelling for 1 and half weeks. Recent
trauma to left lower leg playing basketball.



[Series 1: us extrem low venous*left* · 0.09mm/px · 13 of 52 slices shown]
[im 1/52]
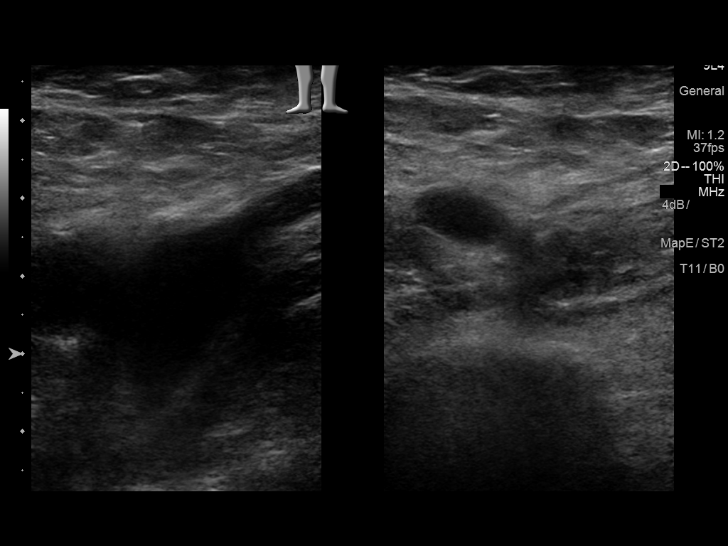
[im 5/52]
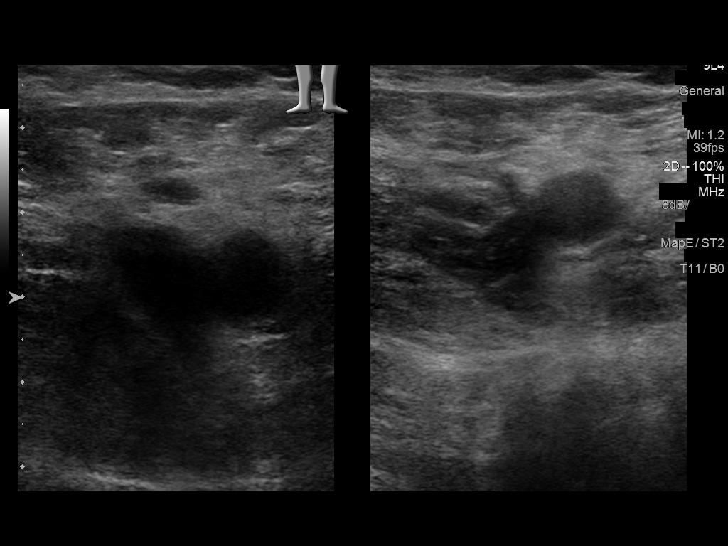
[im 9/52]
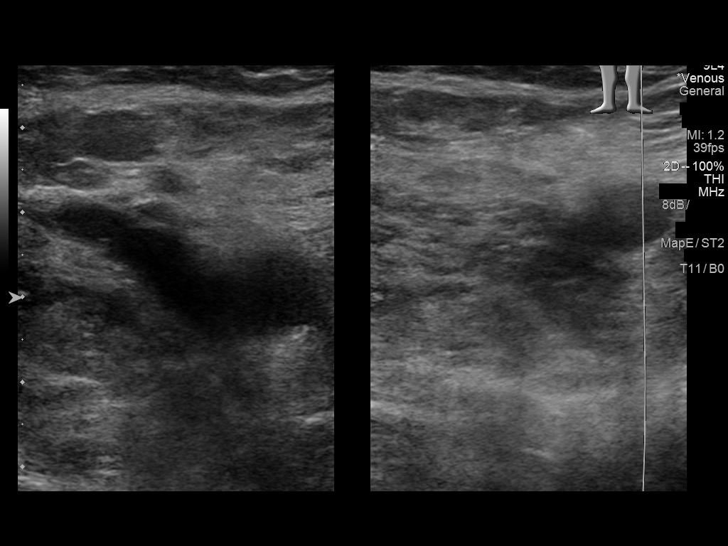
[im 14/52]
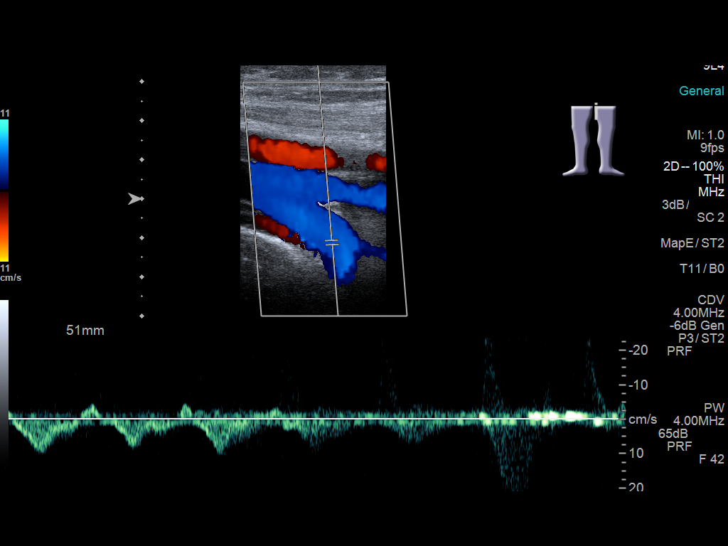
[im 18/52]
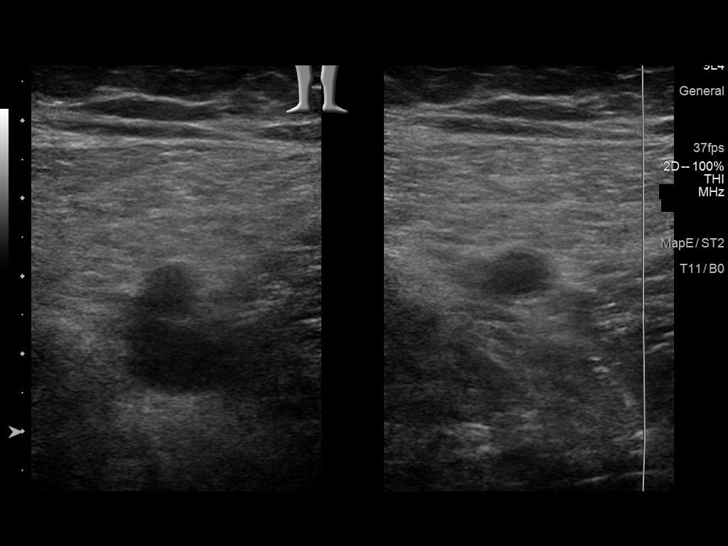
[im 23/52]
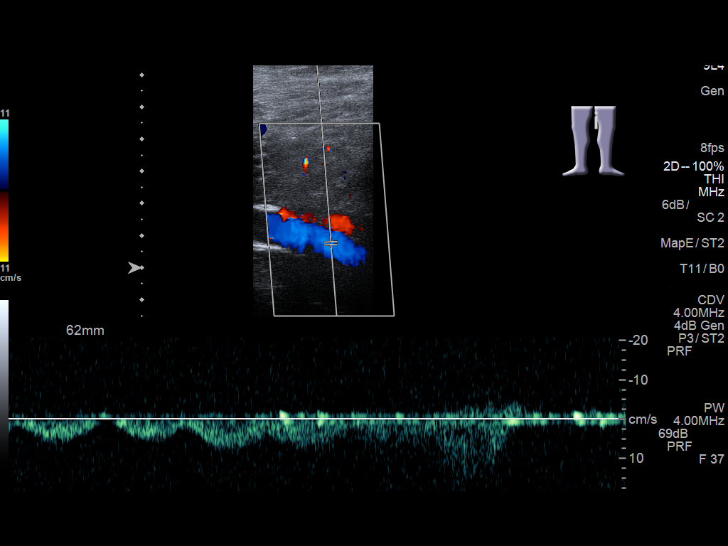
[im 27/52]
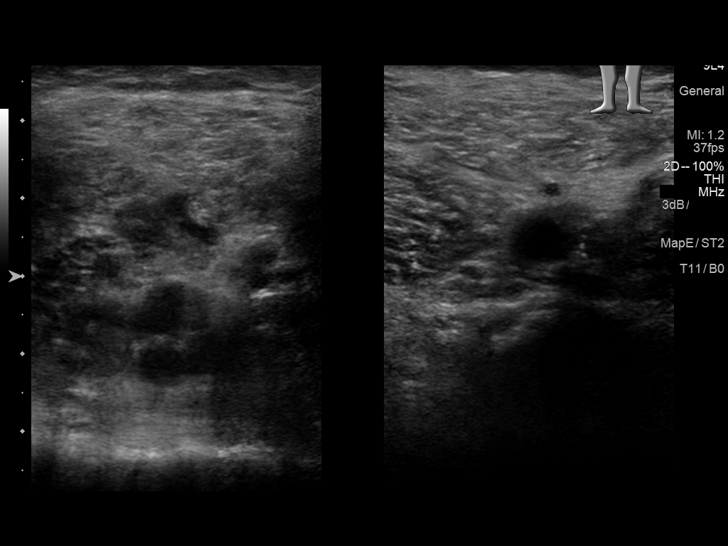
[im 29/52]
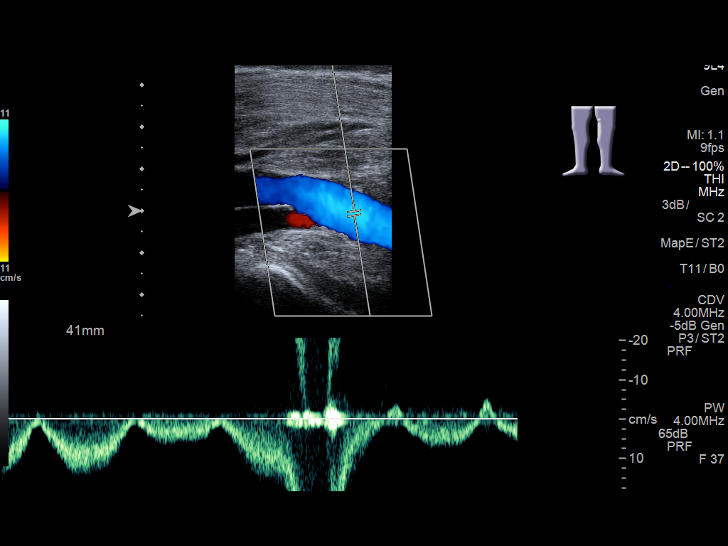
[im 34/52]
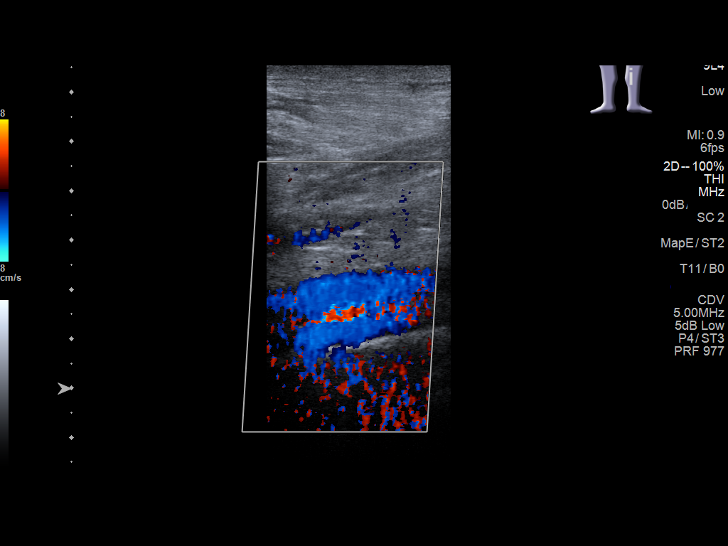
[im 38/52]
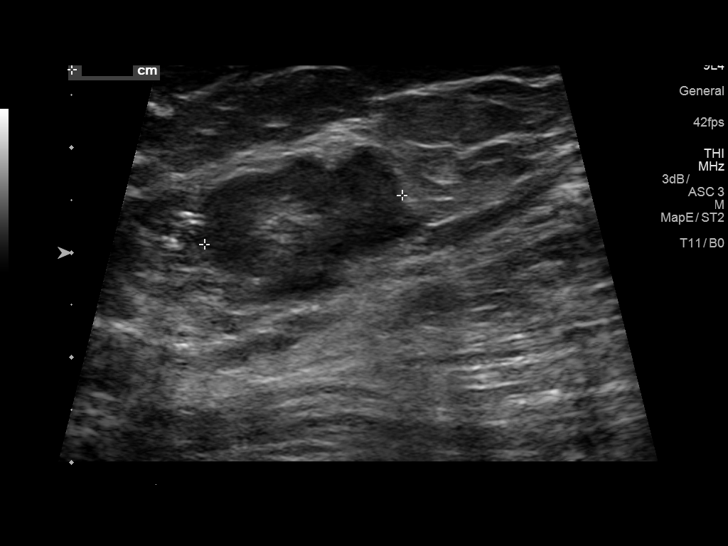
[im 43/52]
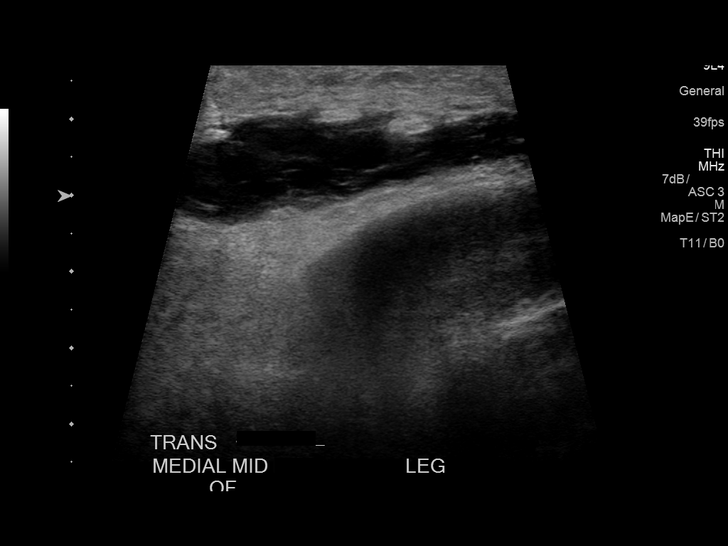
[im 47/52]
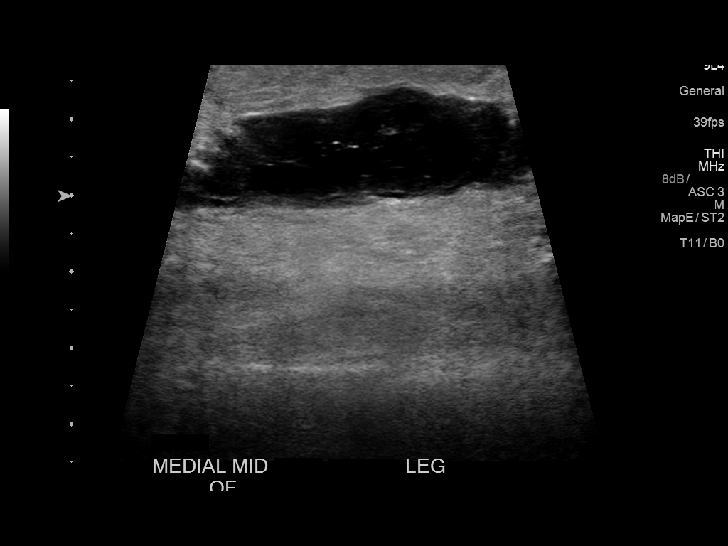
[im 52/52]
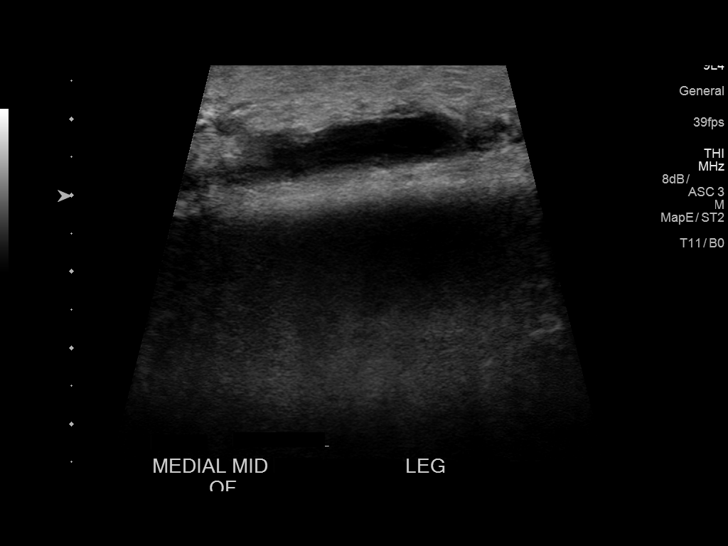

[13 of 24 positions shown; findings below may reference images not displayed]

FINDINGS: Contralateral Common Femoral Vein: Respiratory phasicity is normal
and symmetric with the symptomatic side. No evidence of thrombus.
Normal compressibility.

Common Femoral Vein: No evidence of thrombus. Normal
compressibility, respiratory phasicity and response to augmentation.

Saphenofemoral Junction: No evidence of thrombus. Normal
compressibility and flow on color Doppler imaging.

Profunda Femoral Vein: No evidence of thrombus. Normal
compressibility and flow on color Doppler imaging.

Femoral Vein: No evidence of thrombus. Normal compressibility,
respiratory phasicity and response to augmentation.

Popliteal Vein: No evidence of thrombus. Normal compressibility,
respiratory phasicity and response to augmentation.

Calf Veins: No evidence of thrombus. Normal compressibility and flow
on color Doppler imaging.

Superficial Great Saphenous Vein: No evidence of thrombus. Normal
compressibility and flow on color Doppler imaging.

Venous Reflux:  None.

Other Findings: Complex, hypoechoic structure in the medial left
lower leg (in the area of recent injury) measuring 5.1 x 1.5 x
cm.
IMPRESSION: 1. Negative for deep vein thrombosis.
2. Complex hypoechoic structure within the medial left lower leg in
the area trauma is favored to represent hematoma.

## 2018-04-02 IMAGING — CT CT ANGIO CHEST-ABD-PELV FOR DISSECTION W/ AND WO/W CM
2 of 7 series · 14 of 46 positions shown, 16 images · IV contrast (APPLIED)
Comparison: None.

CLINICAL DATA: 36-year-old male with upper abdominal pain radiating
to the back.

EXAM:
CT ANGIOGRAPHY CHEST, ABDOMEN AND PELVIS
TECHNIQUE: Multidetector CT imaging through the chest, abdomen and pelvis was
performed using the standard protocol during bolus administration of
intravenous contrast. Multiplanar reconstructed images and MIPs were
obtained and reviewed to evaluate the vascular anatomy.
CONTRAST:  100mL [id number] IOPAMIDOL ([id number]) INJECTION 76%

[Series 6: axial arterial · axial · arterial · 0.81mm/px · z∈[-464,+100]mm · 11 of 216 slices shown, 13 images]
[im 14/216  soft-tissue]
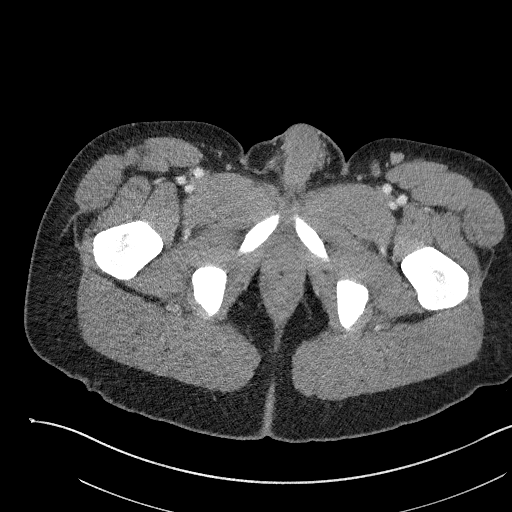
[im 14/216  bone]
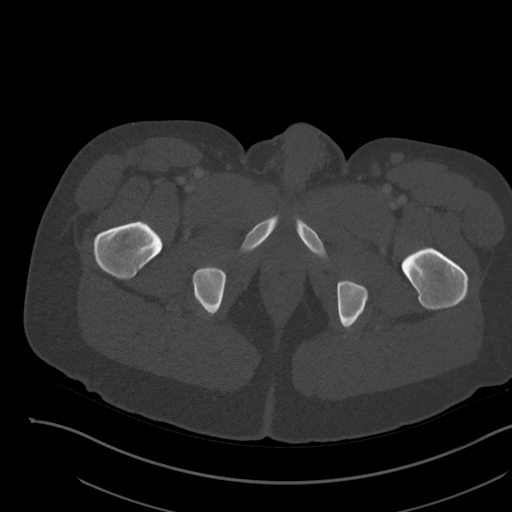
[im 41/216  soft-tissue]
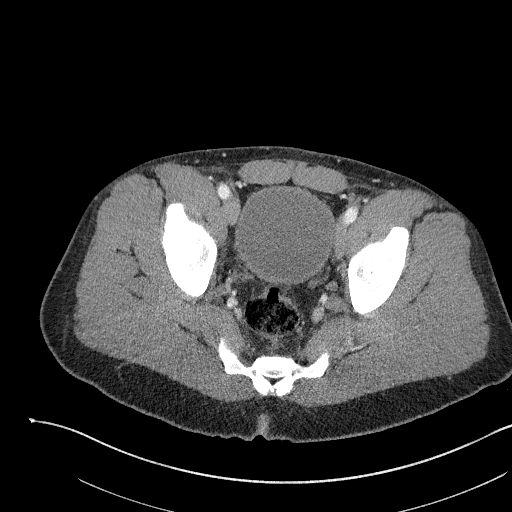
[im 54/216  soft-tissue]
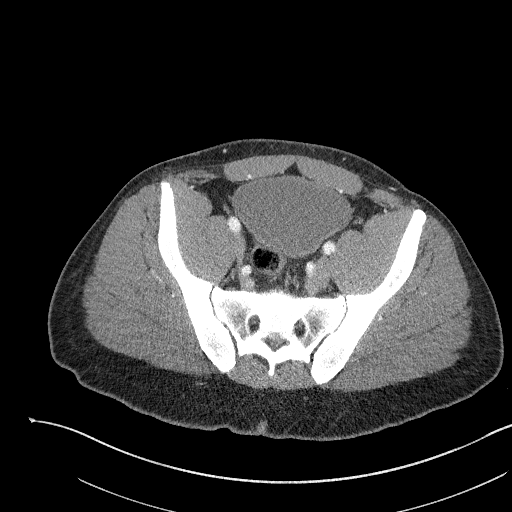
[im 68/216  soft-tissue]
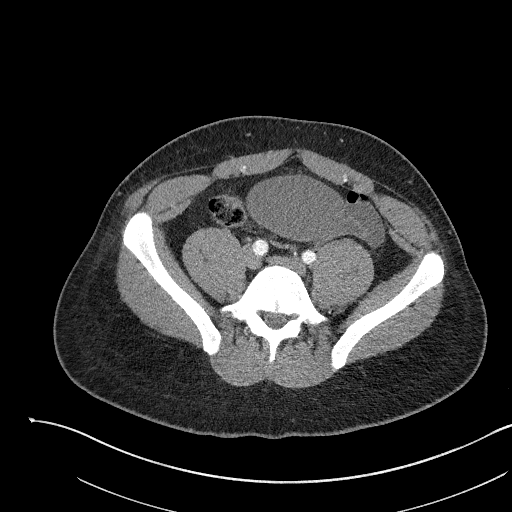
[im 95/216  soft-tissue]
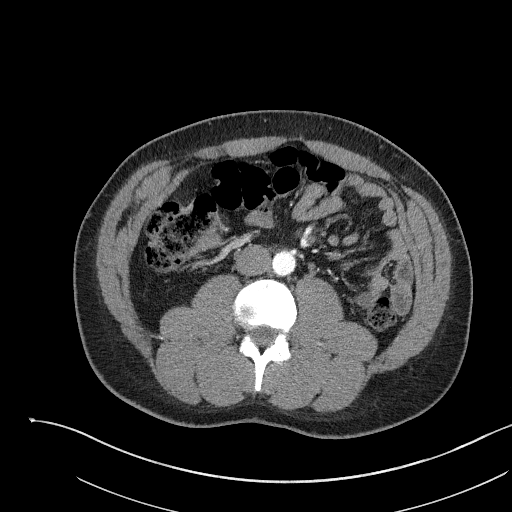
[im 108/216  soft-tissue]
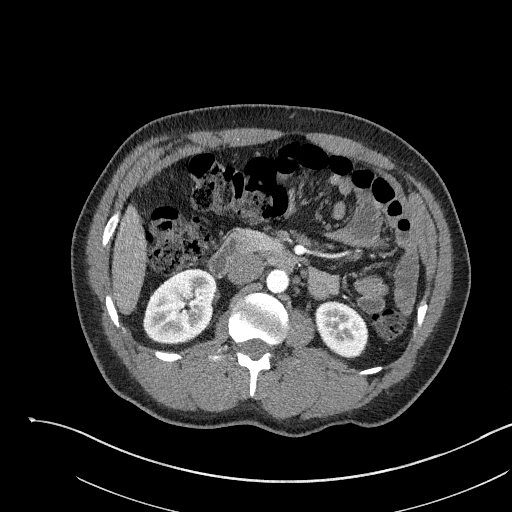
[im 121/216  soft-tissue]
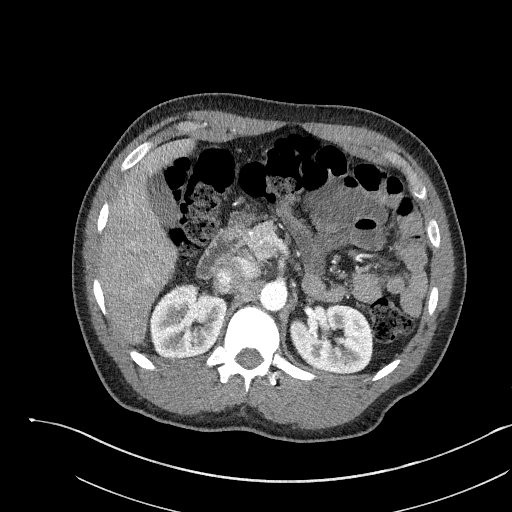
[im 148/216  soft-tissue]
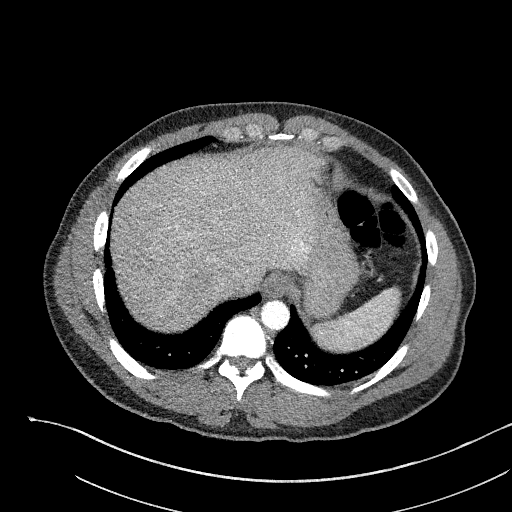
[im 162/216  soft-tissue]
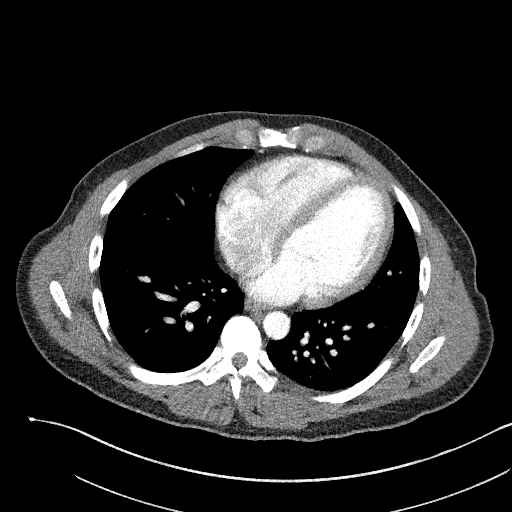
[im 162/216  bone]
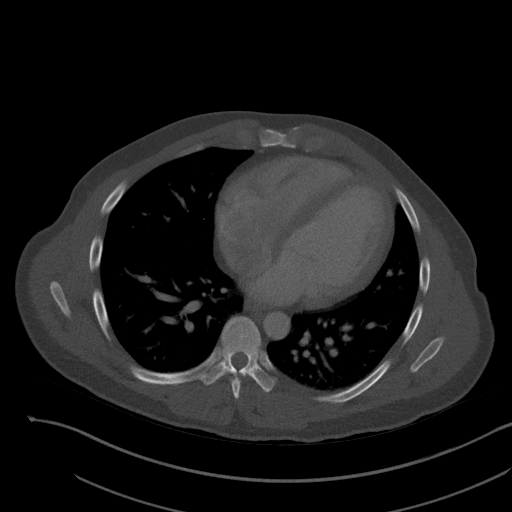
[im 175/216  soft-tissue]
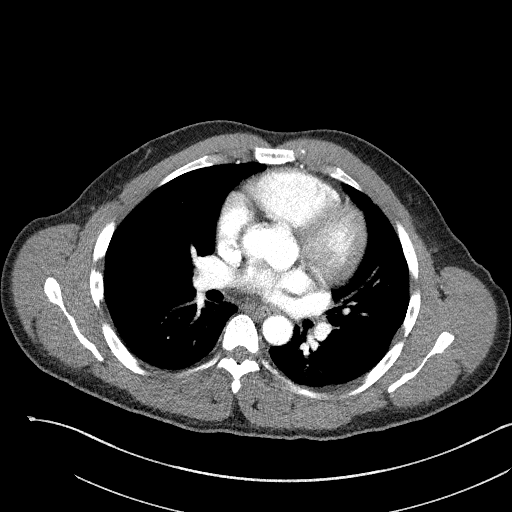
[im 202/216  soft-tissue]
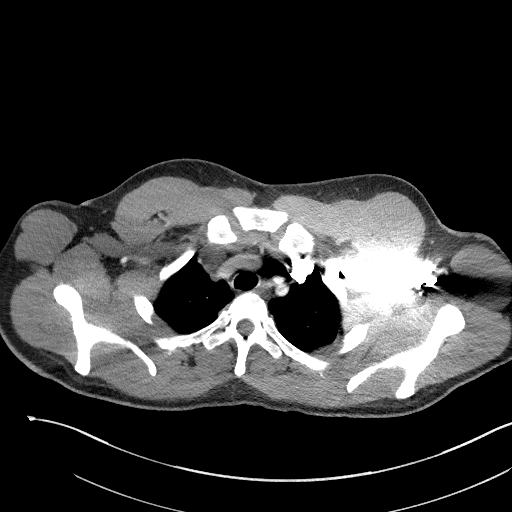

[Series 8: coronals · coronal · 0.80mm/px · 3 of 141 slices shown]
[im 36/141  soft-tissue]
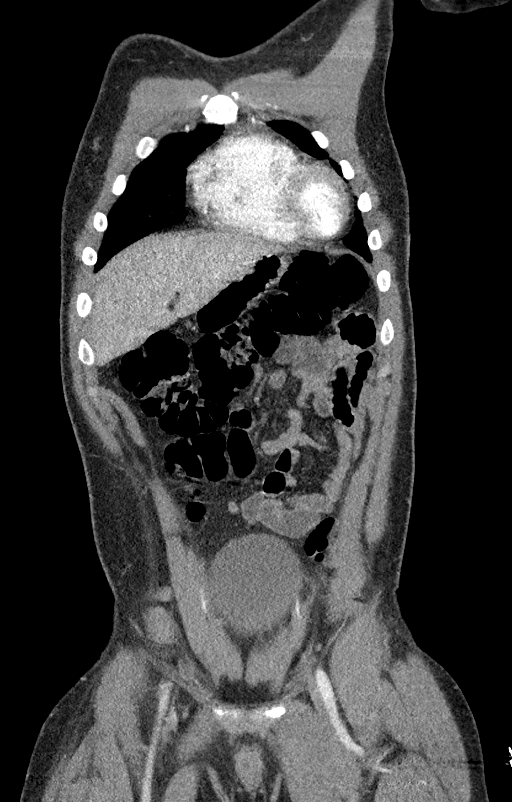
[im 71/141  soft-tissue]
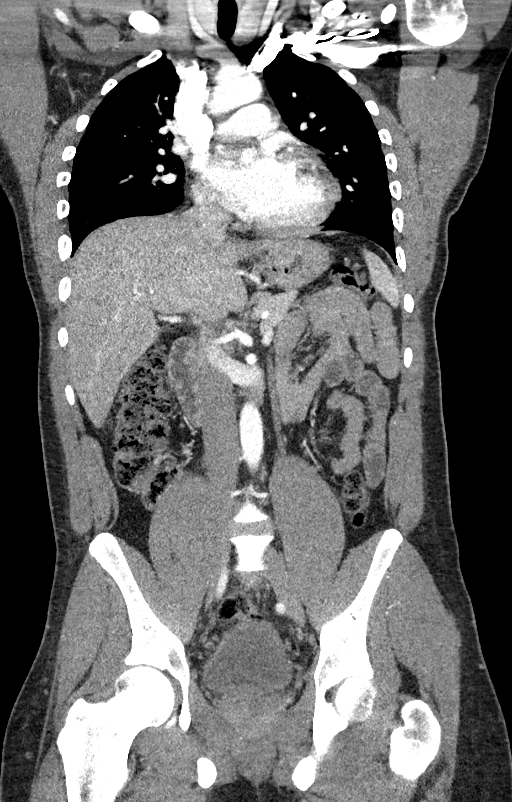
[im 106/141  soft-tissue]
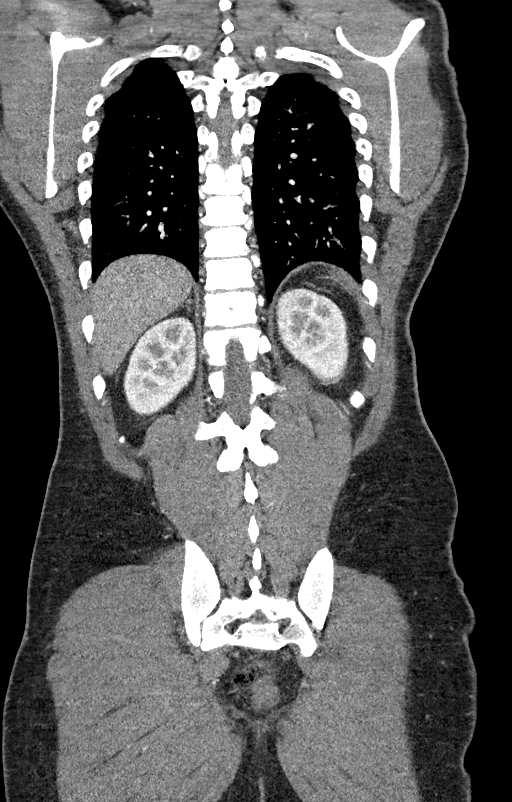

[14 of 46 positions shown; findings below may reference images not displayed]

FINDINGS: CTA CHEST FINDINGS

Cardiovascular: Normal precontrast appearance of the thoracic aorta.
Following contrast the thoracic aorta normally enhances without
dissection, aneurysm, or atherosclerosis. Three vessel arch
configuration with unremarkable appearing proximal great vessels. No
calcified coronary artery atherosclerosis is evident. The central
pulmonary arteries are normally enhancing.

There is cardiomegaly. Global chamber enlargement. See series 6,
image 52. No pericardial effusion.

Mediastinum/Nodes: Negative.  No mediastinal lymphadenopathy.

Lungs/Pleura: Major airways are patent. No pleural effusion. The
lungs are clear aside from minimal dependent atelectasis, mostly on
the left.

Musculoskeletal: Negative.

Review of the MIP images confirms the above findings.

CTA ABDOMEN AND PELVIS FINDINGS

VASCULAR

Normal abdominal aorta. Variant anatomy of the celiac axis
incidentally noted. Major aortic branches are patent with no
atherosclerosis. Normal aortoiliac bifurcation. Normal bilateral
iliac arteries. Normal visible proximal femoral arteries.

Review of the MIP images confirms the above findings.

NON-VASCULAR

Hepatobiliary: Negative liver and gallbladder.

Pancreas: Negative.

Spleen: Negative.

Adrenals/Urinary Tract:

Normal adrenal glands.

Symmetric and normal appearing bilateral renal enhancement. No
hydronephrosis or perinephric stranding. No nephrolithiasis
identified. Symmetric proximal ureters appear normal. Along the
course of the distal left ureter on series 6, image 179 there is a
5-6 millimeter calcification, but I favor this is just anterior to
the distal ureter, and a phlebolith. There are numerous other pelvic
phleboliths which are greater on the left.

The urinary bladder is distended with an estimated volume of 444
milliliters, but otherwise unremarkable

Stomach/Bowel:

Negative rectum. The sigmoid colon is redundant tracking into the
right mid abdomen. There are several diverticula at the junction of
the descending and sigmoid colon. Redundant transverse colon.

Negative right colon and appendix.

No large bowel inflammation identified. No dilated small bowel.
Decompressed stomach and duodenum.

No abdominal free air or free fluid.

Lymphatic: No lymphadenopathy.

Reproductive: Negative.

Other: No pelvic free fluid.

Musculoskeletal: Negative.

Review of the MIP images confirms the above findings.
IMPRESSION: 1. Normal aorta and other arterial findings on CTA chest abdomen and
pelvis.
2. Cardiomegaly with global appearing chamber enlargement. No
pericardial effusion.
3. No acute or inflammatory process identified in the chest,
abdomen, or pelvis.
4. Distended urinary bladder, 444 mL estimated volume.

## 2018-12-16 IMAGING — US US ABDOMEN LIMITED
1 series · 14 of 25 positions shown · non-contrast
Comparison: None.

CLINICAL DATA: Epigastric pain

EXAM:
ULTRASOUND ABDOMEN LIMITED RIGHT UPPER QUADRANT

[Series 1: us abdomen limited · 0.22mm/px · 14 of 49 slices shown]
[im 1/49]
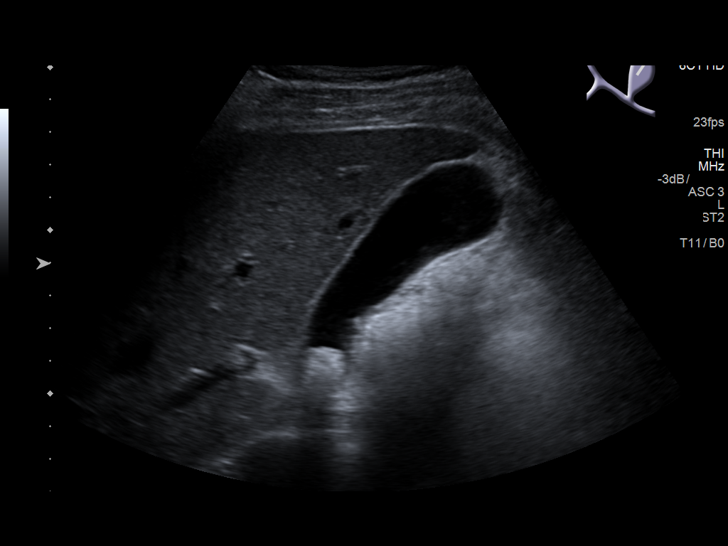
[im 5/49]
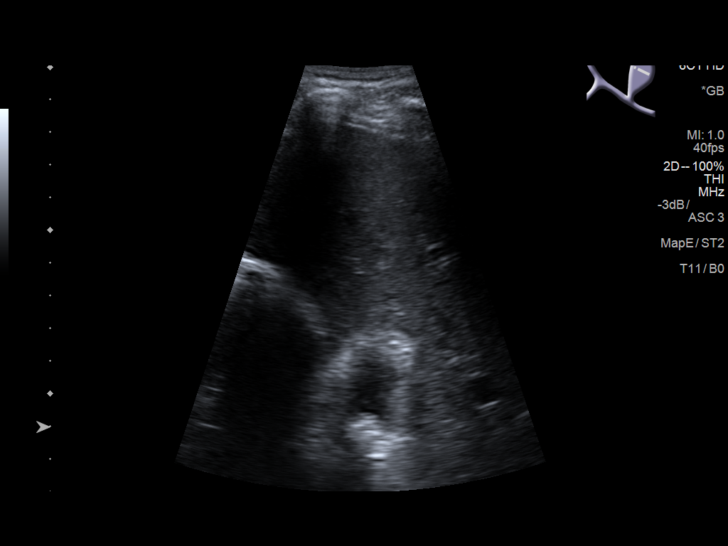
[im 9/49]
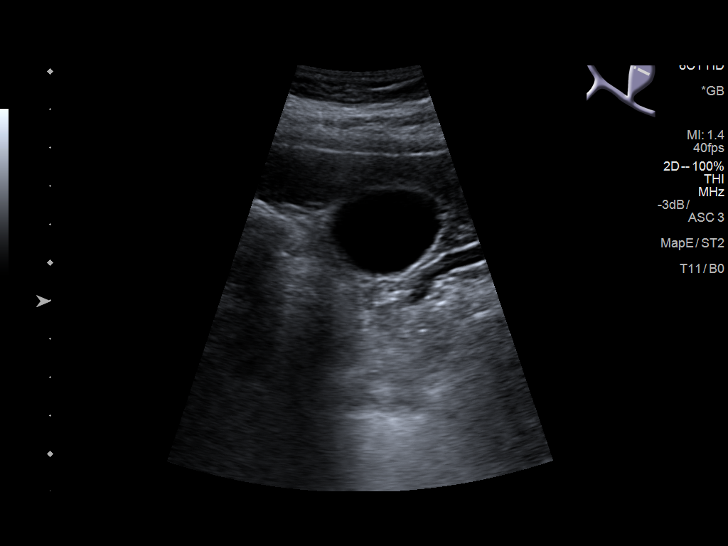
[im 13/49]
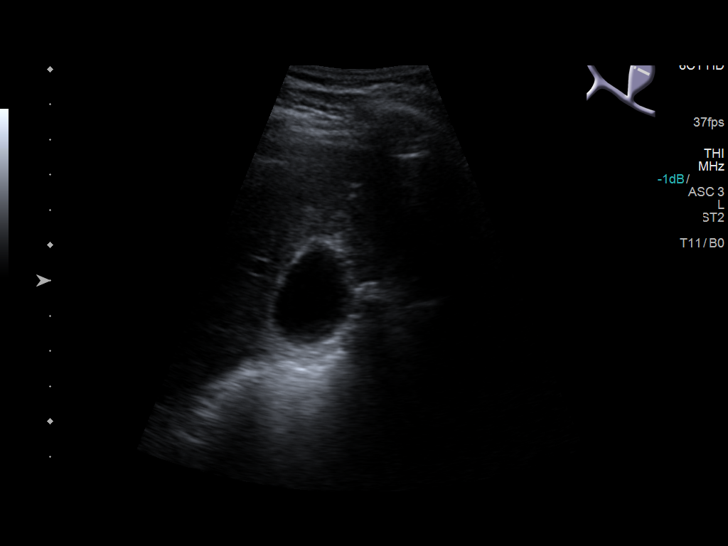
[im 17/49]
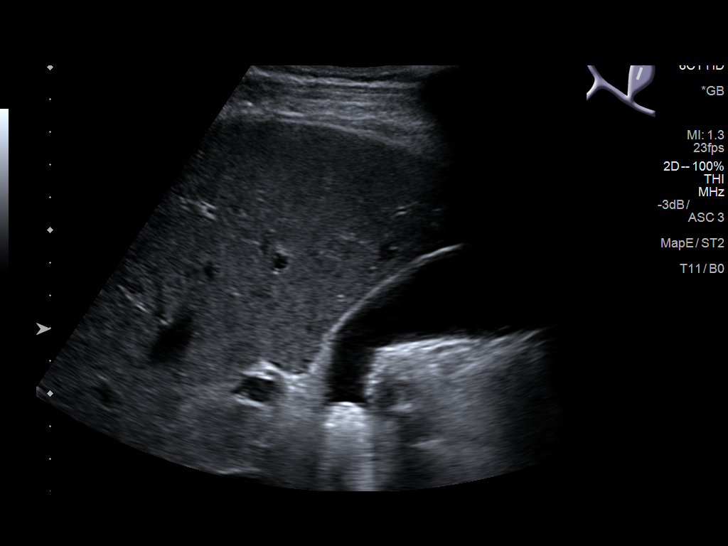
[im 19/49]
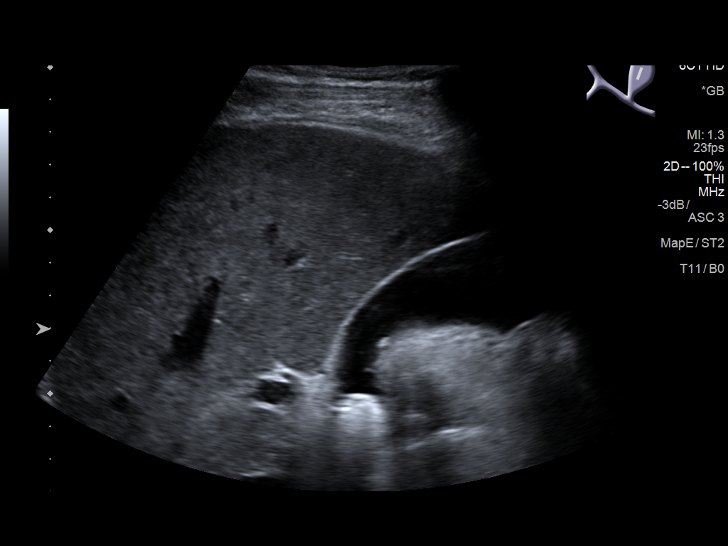
[im 23/49]
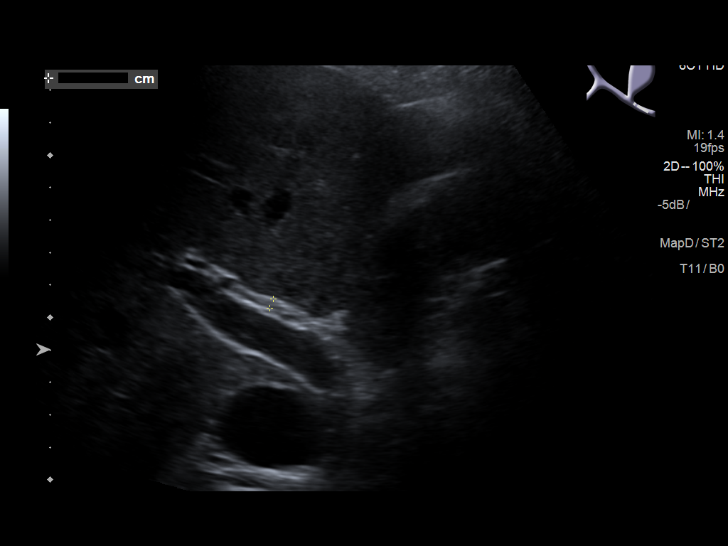
[im 27/49]
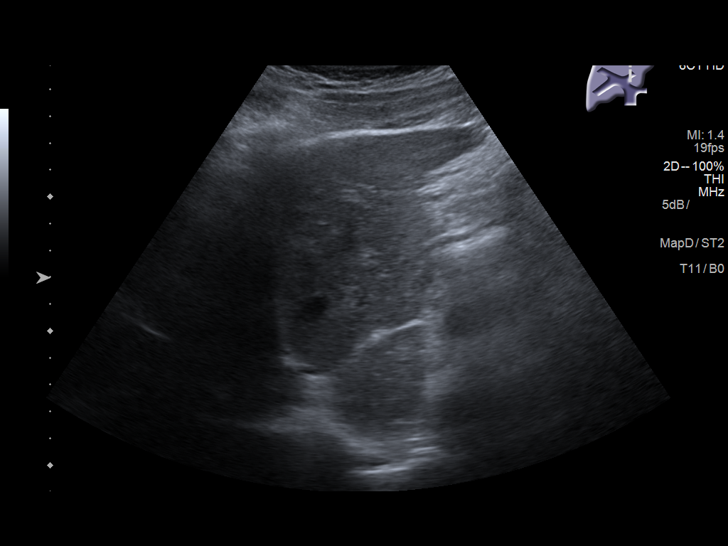
[im 31/49]
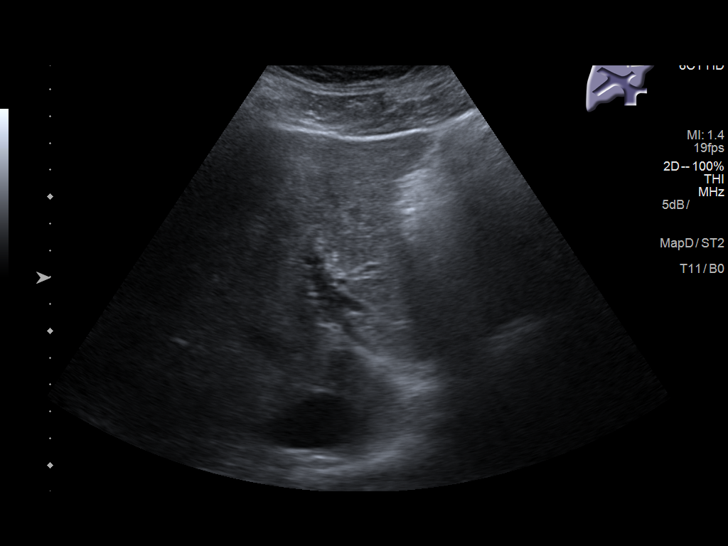
[im 33/49]
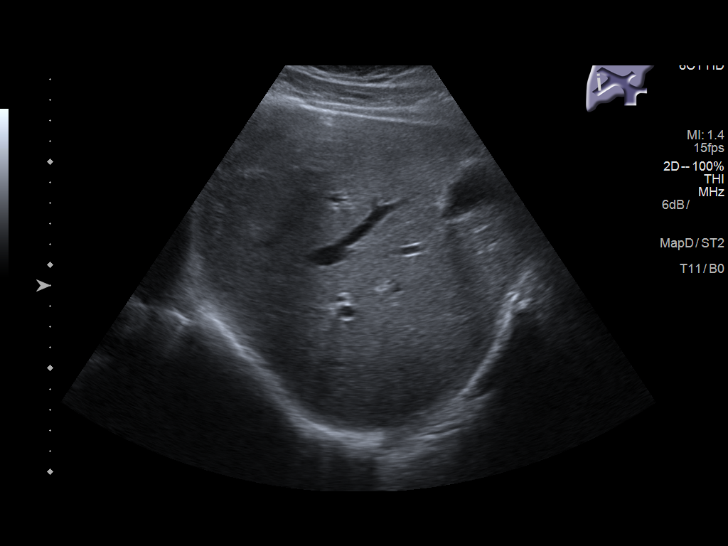
[im 37/49]
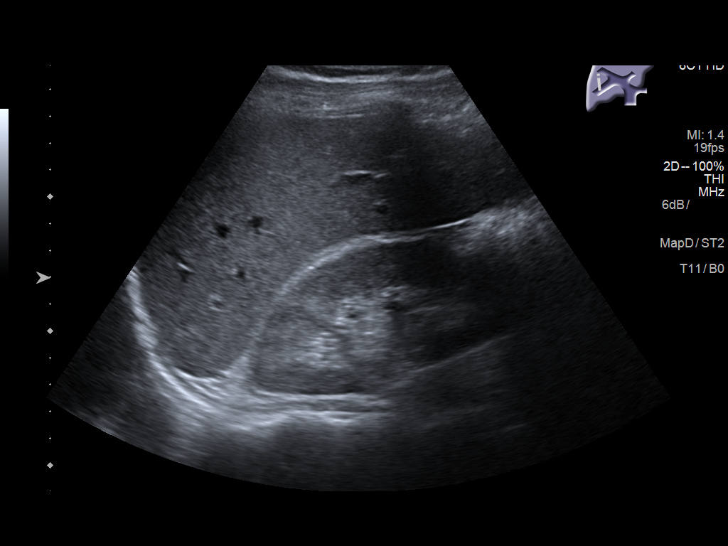
[im 41/49]
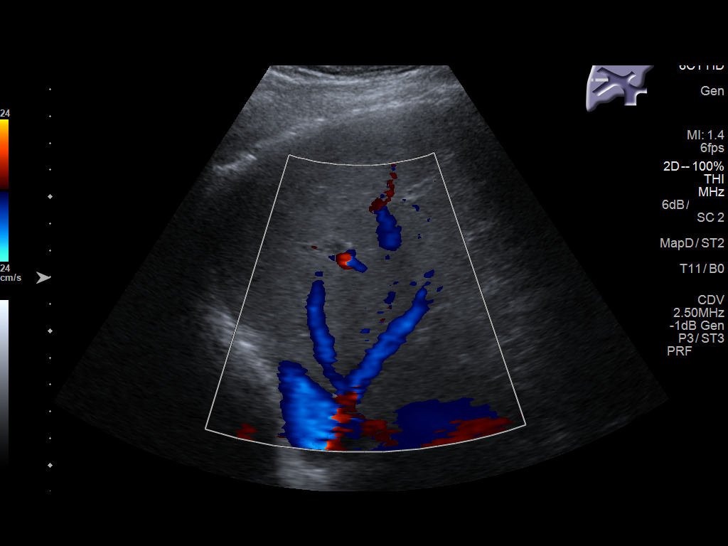
[im 45/49]
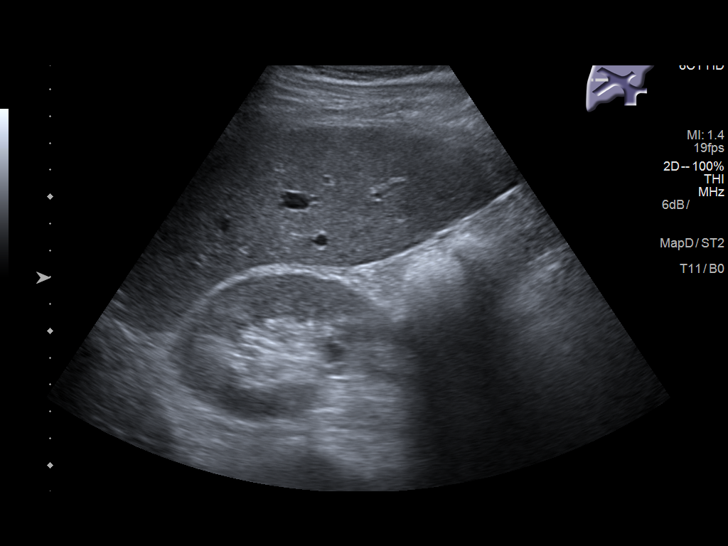
[im 49/49]
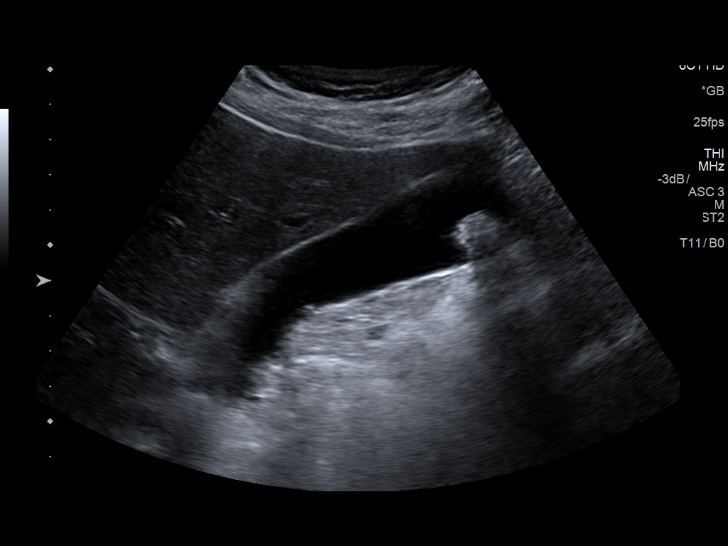

[14 of 25 positions shown; findings below may reference images not displayed]

FINDINGS: Gallbladder:

Single mobile stone measures 1 cm. Small non mobile echogenic focus
measures 4 mm along the posterior gallbladder wall, likely small
polyp. No wall thickening or sonographic Badal.

Common bile duct:

Diameter: Normal caliber, 3 mm

Liver:

No focal lesion identified. Within normal limits in parenchymal
echogenicity. Portal vein is patent on color Doppler imaging with
normal direction of blood flow towards the liver.
IMPRESSION: Cholelithiasis. Small gallbladder wall polyp. No evidence of acute
cholecystitis.
